# Patient Record
Sex: Male | Born: 2000 | Hispanic: Yes | Marital: Single
Health system: Southern US, Community
[De-identification: ages and names within clinical notes are randomized; demographics above are authoritative.]

---

## 2021-04-06 ENCOUNTER — Inpatient Hospital Stay (HOSPITAL_COMMUNITY): Payer: Self-pay

## 2021-04-06 ENCOUNTER — Inpatient Hospital Stay (HOSPITAL_COMMUNITY)
Admission: EM | Admit: 2021-04-06 | Discharge: 2021-04-09 | DRG: 871 | Disposition: A | Discharge status: Home / Self Care | Payer: Self-pay | Attending: Internal Medicine | Admitting: Internal Medicine

## 2021-04-06 ENCOUNTER — Emergency Department (HOSPITAL_COMMUNITY): Payer: Self-pay

## 2021-04-06 ENCOUNTER — Encounter (HOSPITAL_COMMUNITY): Payer: Self-pay | Admitting: Emergency Medicine

## 2021-04-06 DIAGNOSIS — R6521 Severe sepsis with septic shock: Secondary | ICD-10-CM | POA: Diagnosis present

## 2021-04-06 DIAGNOSIS — E876 Hypokalemia: Secondary | ICD-10-CM | POA: Diagnosis present

## 2021-04-06 DIAGNOSIS — E871 Hypo-osmolality and hyponatremia: Secondary | ICD-10-CM | POA: Diagnosis present

## 2021-04-06 DIAGNOSIS — E878 Other disorders of electrolyte and fluid balance, not elsewhere classified: Secondary | ICD-10-CM | POA: Diagnosis present

## 2021-04-06 DIAGNOSIS — R410 Disorientation, unspecified: Secondary | ICD-10-CM

## 2021-04-06 DIAGNOSIS — R569 Unspecified convulsions: Secondary | ICD-10-CM

## 2021-04-06 DIAGNOSIS — J101 Influenza due to other identified influenza virus with other respiratory manifestations: Secondary | ICD-10-CM | POA: Diagnosis present

## 2021-04-06 DIAGNOSIS — A419 Sepsis, unspecified organism: Principal | ICD-10-CM | POA: Diagnosis present

## 2021-04-06 DIAGNOSIS — Z66 Do not resuscitate: Secondary | ICD-10-CM | POA: Diagnosis present

## 2021-04-06 DIAGNOSIS — R4182 Altered mental status, unspecified: Secondary | ICD-10-CM | POA: Diagnosis present

## 2021-04-06 DIAGNOSIS — G9341 Metabolic encephalopathy: Secondary | ICD-10-CM | POA: Diagnosis present

## 2021-04-06 DIAGNOSIS — Z20822 Contact with and (suspected) exposure to covid-19: Secondary | ICD-10-CM | POA: Diagnosis present

## 2021-04-06 LAB — LIPASE, BLOOD: Lipase: 27 U/L (ref 11–51)

## 2021-04-06 LAB — COMPREHENSIVE METABOLIC PANEL
ALT: 29 U/L (ref 0–44)
AST: 29 U/L (ref 15–41)
Albumin: 4.4 g/dL (ref 3.5–5.0)
Alkaline Phosphatase: 91 U/L (ref 38–126)
Anion gap: 12 (ref 5–15)
BUN: 16 mg/dL (ref 6–20)
CO2: 22 mmol/L (ref 22–32)
Calcium: 9.8 mg/dL (ref 8.9–10.3)
Chloride: 97 mmol/L — ABNORMAL LOW (ref 98–111)
Creatinine, Ser: 1.02 mg/dL (ref 0.61–1.24)
GFR, Estimated: >60 mL/min (ref >60)
Glucose, Bld: 100 mg/dL — ABNORMAL HIGH (ref 70–99)
Potassium: 3.4 mmol/L — ABNORMAL LOW (ref 3.5–5.1)
Sodium: 131 mmol/L — ABNORMAL LOW (ref 135–145)
Total Bilirubin: 0.6 mg/dL (ref 0.3–1.2)
Total Protein: 7.8 g/dL (ref 6.5–8.1)

## 2021-04-06 LAB — HIV ANTIBODY (ROUTINE TESTING W REFLEX): HIV Screen 4th Generation wRfx: NONREACTIVE

## 2021-04-06 LAB — I-STAT VENOUS BLOOD GAS, ED
Acid-Base Excess: 2 mmol/L (ref 0.0–2.0)
Bicarbonate: 24.4 mmol/L (ref 20.0–28.0)
Calcium, Ion: 1.09 mmol/L — ABNORMAL LOW (ref 1.15–1.40)
HCT: 47 % (ref 39.0–52.0)
Hemoglobin: 16 g/dL (ref 13.0–17.0)
O2 Saturation: 74 %
Potassium: 3.3 mmol/L — ABNORMAL LOW (ref 3.5–5.1)
Sodium: 135 mmol/L (ref 135–145)
TCO2: 25 mmol/L (ref 22–32)
pCO2, Ven: 31 mmHg — ABNORMAL LOW (ref 44.0–60.0)
pH, Ven: 7.504 — ABNORMAL HIGH (ref 7.250–7.430)
pO2, Ven: 35 mmHg (ref 32.0–45.0)

## 2021-04-06 LAB — LACTIC ACID, PLASMA: Lactic Acid, Venous: 1.2 mmol/L (ref 0.5–1.9)

## 2021-04-06 LAB — URINALYSIS, ROUTINE W REFLEX MICROSCOPIC
Bilirubin Urine: NEGATIVE
Glucose, UA: NEGATIVE mg/dL
Hgb urine dipstick: NEGATIVE
Ketones, ur: 5 mg/dL — AB
Leukocytes,Ua: NEGATIVE
Nitrite: NEGATIVE
Protein, ur: NEGATIVE mg/dL
Specific Gravity, Urine: 1.026 (ref 1.005–1.030)
pH: 7 (ref 5.0–8.0)

## 2021-04-06 LAB — RAPID URINE DRUG SCREEN, HOSP PERFORMED
Amphetamines: NOT DETECTED
Barbiturates: NOT DETECTED
Benzodiazepines: POSITIVE — AB
Cocaine: NOT DETECTED
Opiates: POSITIVE — AB
Tetrahydrocannabinol: NOT DETECTED

## 2021-04-06 LAB — CBC WITH DIFFERENTIAL/PLATELET
Abs Immature Granulocytes: 0.06 10*3/uL (ref 0.00–0.07)
Basophils Absolute: 0.1 10*3/uL (ref 0.0–0.1)
Basophils Relative: 1 %
Eosinophils Absolute: 0.1 10*3/uL (ref 0.0–0.5)
Eosinophils Relative: 1 %
HCT: 45 % (ref 39.0–52.0)
Hemoglobin: 15.1 g/dL (ref 13.0–17.0)
Immature Granulocytes: 1 %
Lymphocytes Relative: 7 %
Lymphs Abs: 0.8 10*3/uL (ref 0.7–4.0)
MCH: 28.9 pg (ref 26.0–34.0)
MCHC: 33.6 g/dL (ref 30.0–36.0)
MCV: 86.2 fL (ref 80.0–100.0)
Monocytes Absolute: 0.7 10*3/uL (ref 0.1–1.0)
Monocytes Relative: 6 %
Neutro Abs: 9.9 10*3/uL — ABNORMAL HIGH (ref 1.7–7.7)
Neutrophils Relative %: 84 %
Platelets: 376 10*3/uL (ref 150–400)
RBC: 5.22 MIL/uL (ref 4.22–5.81)
RDW: 12.6 % (ref 11.5–15.5)
WBC: 11.6 10*3/uL — ABNORMAL HIGH (ref 4.0–10.5)
nRBC: 0 % (ref 0.0–0.2)

## 2021-04-06 LAB — RESP PANEL BY RT-PCR (FLU A&B, COVID) ARPGX2
Influenza A by PCR: POSITIVE — AB
Influenza B by PCR: NEGATIVE
SARS Coronavirus 2 by RT PCR: NEGATIVE

## 2021-04-06 LAB — ACETAMINOPHEN LEVEL: Acetaminophen (Tylenol), Serum: <10 ug/mL — ABNORMAL LOW (ref 10–30)

## 2021-04-06 LAB — OSMOLALITY: Osmolality: 280 mOsm/kg (ref 275–295)

## 2021-04-06 LAB — CBG MONITORING, ED: Glucose-Capillary: 85 mg/dL (ref 70–99)

## 2021-04-06 LAB — MAGNESIUM: Magnesium: 1.5 mg/dL — ABNORMAL LOW (ref 1.7–2.4)

## 2021-04-06 LAB — CK: Total CK: 306 U/L (ref 49–397)

## 2021-04-06 LAB — SALICYLATE LEVEL: Salicylate Lvl: <7 mg/dL — ABNORMAL LOW (ref 7.0–30.0)

## 2021-04-06 IMAGING — MR MR HEAD WO/W CM
14 of 16 series · 40 of 48 positions shown · IV contrast (gadavist)
Comparison: None.

CLINICAL DATA: Fever and new onset seizure

EXAM:
MRI HEAD WITHOUT AND WITH CONTRAST
TECHNIQUE: Multiplanar, multiecho pulse sequences of the brain and surrounding
structures were obtained without and with intravenous contrast.
CONTRAST:  7mL GADAVIST GADOBUTROL 1 MMOL/ML IV SOLN

[Series 5: DWI · axial · 3.0mm · 0.88mm/px · z∈[-138,+14]mm · 6 of 106 slices shown (1 of 4)]
[im 1/106]
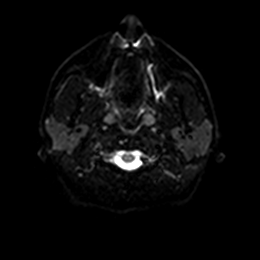
[im 22/106]
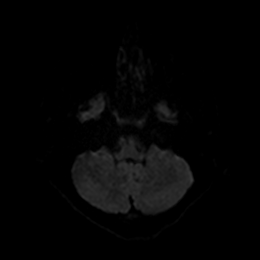
[im 43/106]
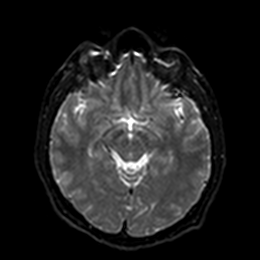
[im 64/106]
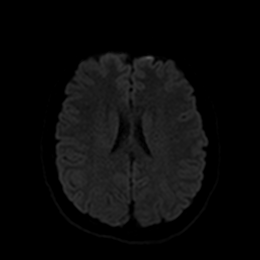
[im 85/106]
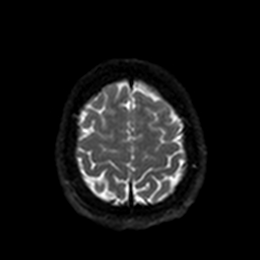
[im 106/106]
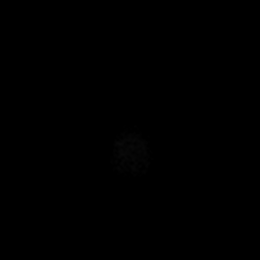

[Series 6: DWI · axial · 3.0mm · 0.88mm/px · z∈[-138,+14]mm · 2 of 52 slices shown (2 of 4)]
[im 1/52]
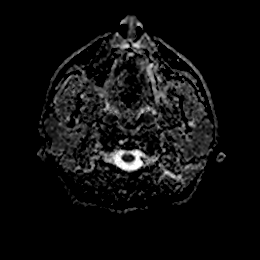
[im 52/52]
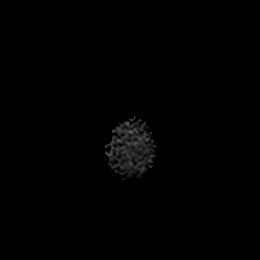

[Series 7: DWI · coronal · 4.0mm · 0.88mm/px · 4 of 74 slices shown (3 of 4)]
[im 1/74]
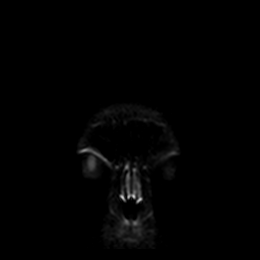
[im 25/74]
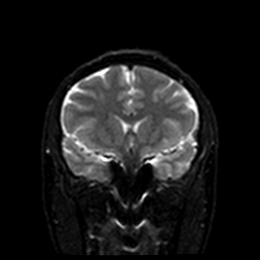
[im 49/74]
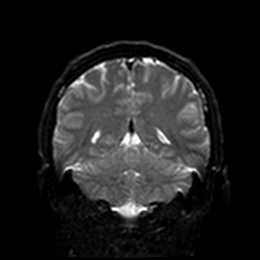
[im 74/74]
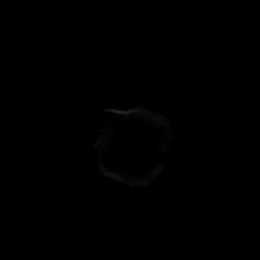

[Series 8: DWI · coronal · 4.0mm · 0.88mm/px · 2 of 37 slices shown (4 of 4)]
[im 1/37]
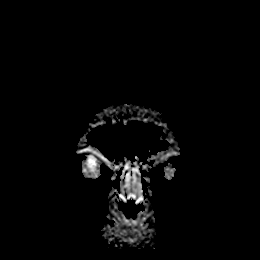
[im 37/37]
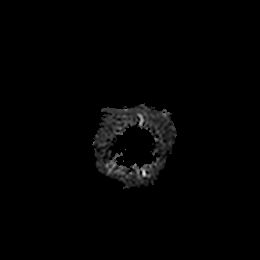

[Series 9: T1 · sagittal · 5.0mm · 0.75mm/px · 2 of 26 slices shown]
[im 1/26]
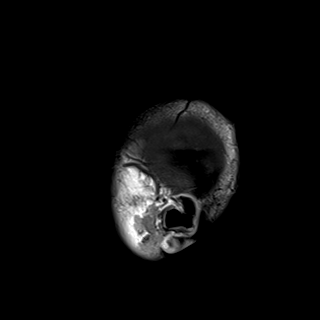
[im 26/26]
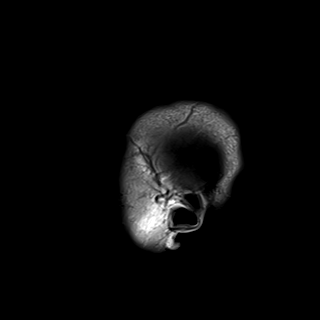

[Series 10: T2 · axial · 5.0mm · 0.72mm/px · z∈[-138,+14]mm · 2 of 27 slices shown]
[im 1/27]
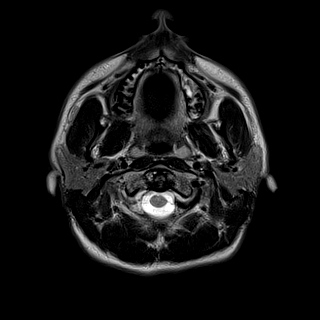
[im 27/27]
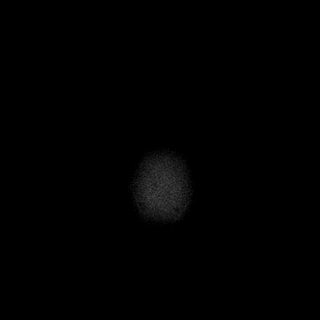

[Series 11: FLAIR · axial · 5.0mm · 0.45mm/px · z∈[-138,+15]mm · 2 of 27 slices shown]
[im 1/27]
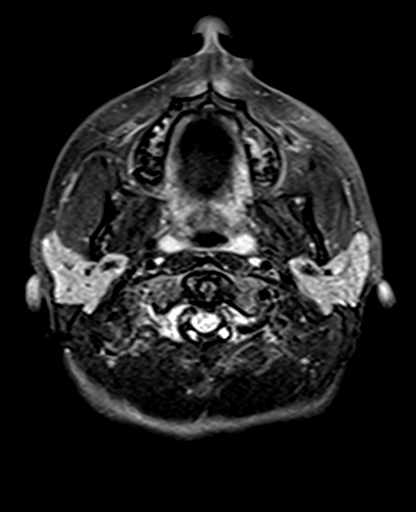
[im 27/27]
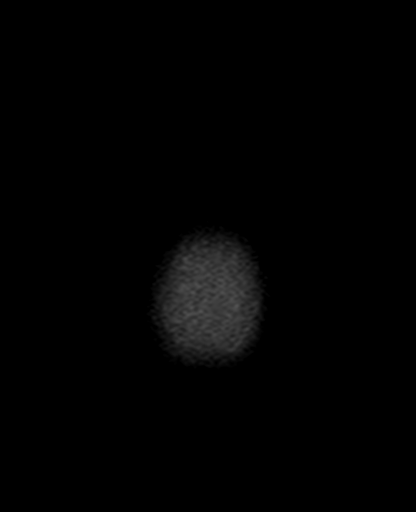

[Series 12: mag_images · axial · 3.0mm · 0.90mm/px · z∈[-142,+19]mm · 4 of 56 slices shown]
[im 1/56]
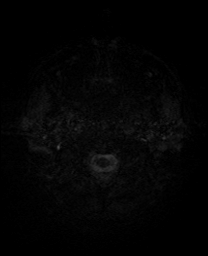
[im 19/56]
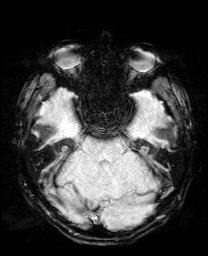
[im 37/56]
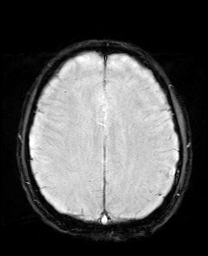
[im 56/56]
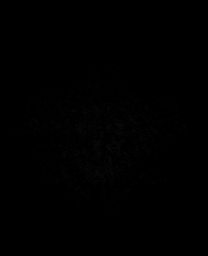

[Series 13: pha_images · axial · 3.0mm · 0.90mm/px · z∈[-142,+14]mm · 3 of 54 slices shown]
[im 1/54]
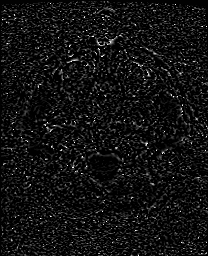
[im 27/54]
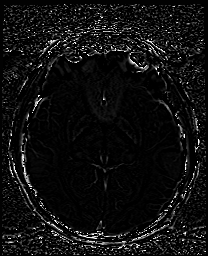
[im 54/54]
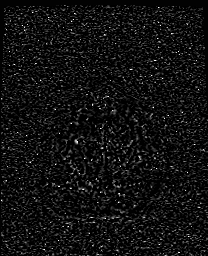

[Series 14: swi_images · axial · 3.0mm · 0.90mm/px · z∈[-142,+19]mm · 4 of 56 slices shown]
[im 1/56]
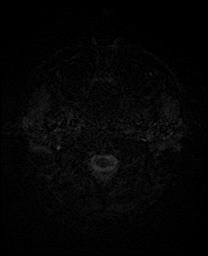
[im 19/56]
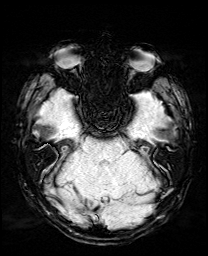
[im 37/56]
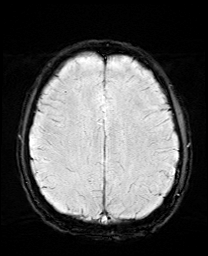
[im 56/56]
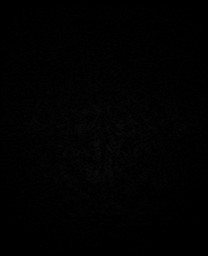

[Series 15: mip_images(sw) · axial · 24.0mm · 0.90mm/px · z∈[-132,+9]mm · 3 of 49 slices shown]
[im 1/49]
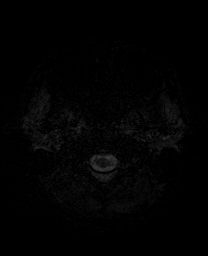
[im 25/49]
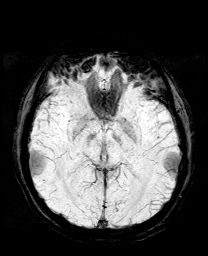
[im 49/49]
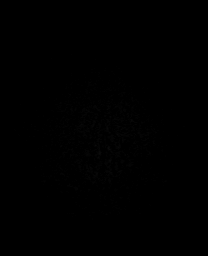

[Series 17: T2 post-contrast · coronal · 5.0mm · 0.72mm/px · 2 of 30 slices shown]
[im 1/30]
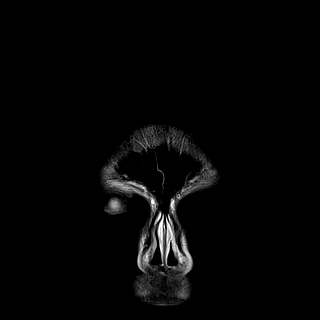
[im 30/30]
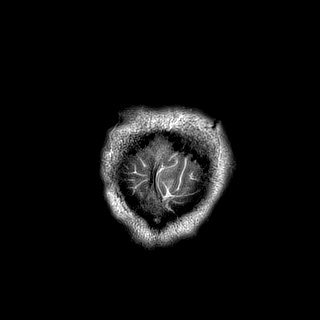

[Series 19: T1 post-contrast · coronal · 5.0mm · 0.34mm/px · 2 of 30 slices shown (1 of 2)]
[im 1/30]
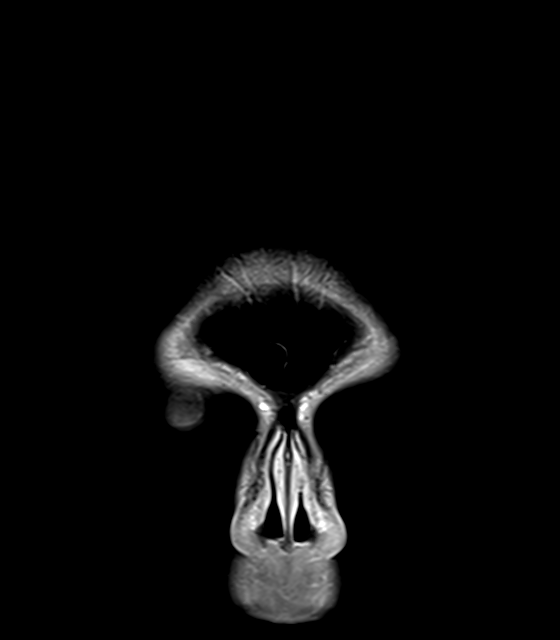
[im 30/30]
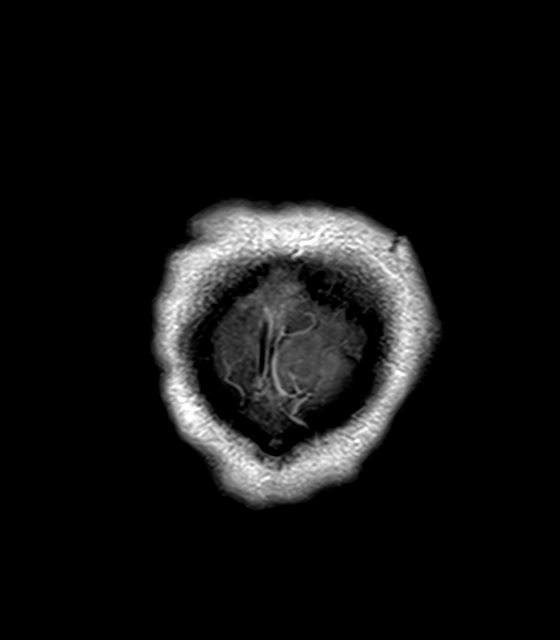

[Series 20: T1 post-contrast · sagittal · 5.0mm · 0.72mm/px · 2 of 26 slices shown (2 of 2)]
[im 1/26]
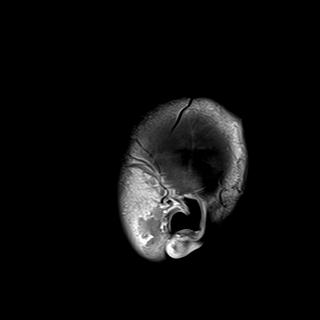
[im 26/26]
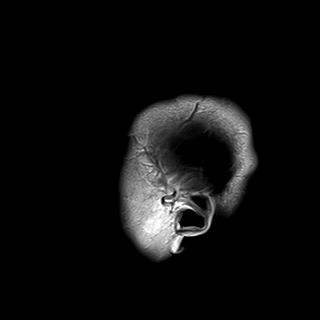

[40 of 48 positions shown; findings below may reference images not displayed]

FINDINGS: Brain: No acute infarct, mass effect or extra-axial collection. No
acute or chronic hemorrhage. Normal white matter signal, parenchymal
volume and CSF spaces. The midline structures are normal. There is
no abnormal contrast enhancement.

Vascular: Major flow voids are preserved.

Skull and upper cervical spine: Normal calvarium and skull base.
Visualized upper cervical spine and soft tissues are normal.

Sinuses/Orbits:No paranasal sinus fluid levels or advanced mucosal
thickening. No mastoid or middle ear effusion. Normal orbits.
IMPRESSION: Normal brain MRI.

## 2021-04-06 IMAGING — MR MR THORACIC SPINE WO/W CM
7 of 11 series · 21 of 48 positions shown · IV contrast (gadavist)
Comparison: None.

CLINICAL DATA: Fever and new onset seizure

EXAM:
MRI THORACIC AND LUMBAR SPINE WITHOUT AND WITH CONTRAST
TECHNIQUE: Multiplanar and multiecho pulse sequences of the thoracic and lumbar
spine were obtained without and with intravenous contrast.
CONTRAST:  7mL GADAVIST GADOBUTROL 1 MMOL/ML IV SOLN

[Series 20: T1 · sagittal · 3.0mm · 0.62mm/px · 1 of 9 slices shown (1 of 5)]
[im 1/9]
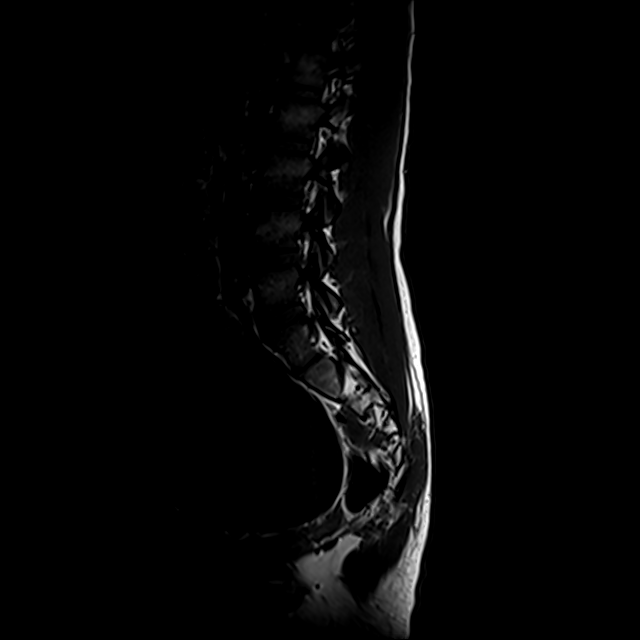

[Series 21: T1 · sagittal · 3.0mm · 0.62mm/px · 2 of 9 slices shown (2 of 5)]
[im 1/9]
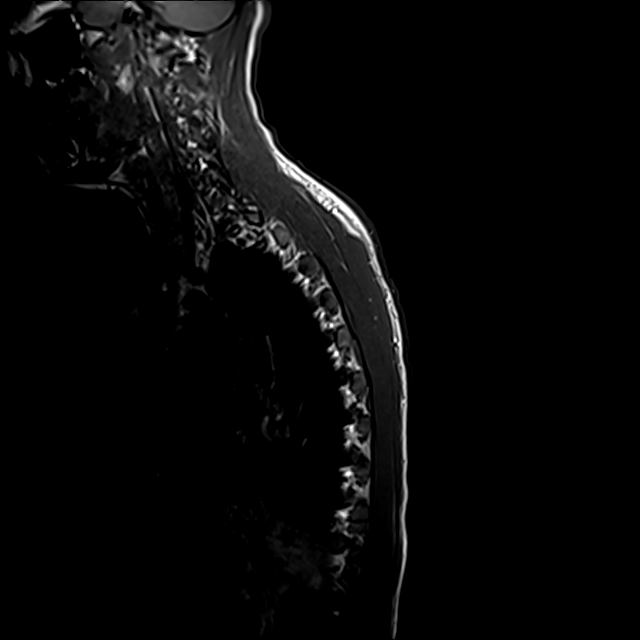
[im 9/9]
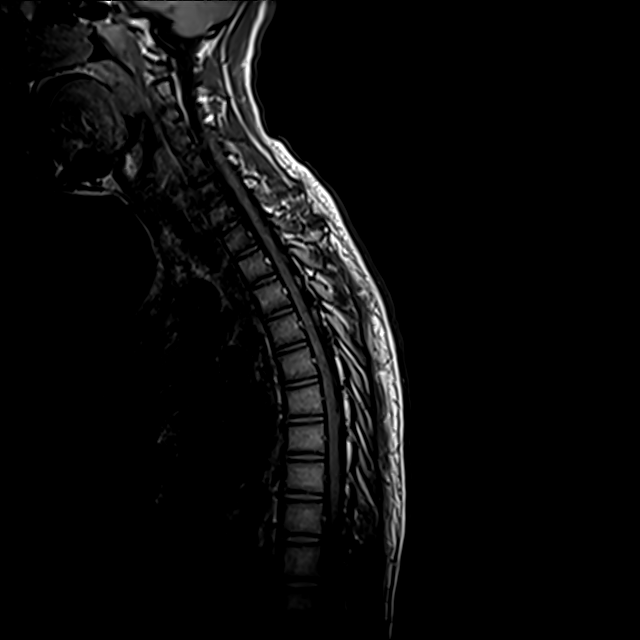

[Series 22: T1 · sagittal · 3.3mm · 0.62mm/px · 1 of 7 slices shown (3 of 5)]
[im 1/7]
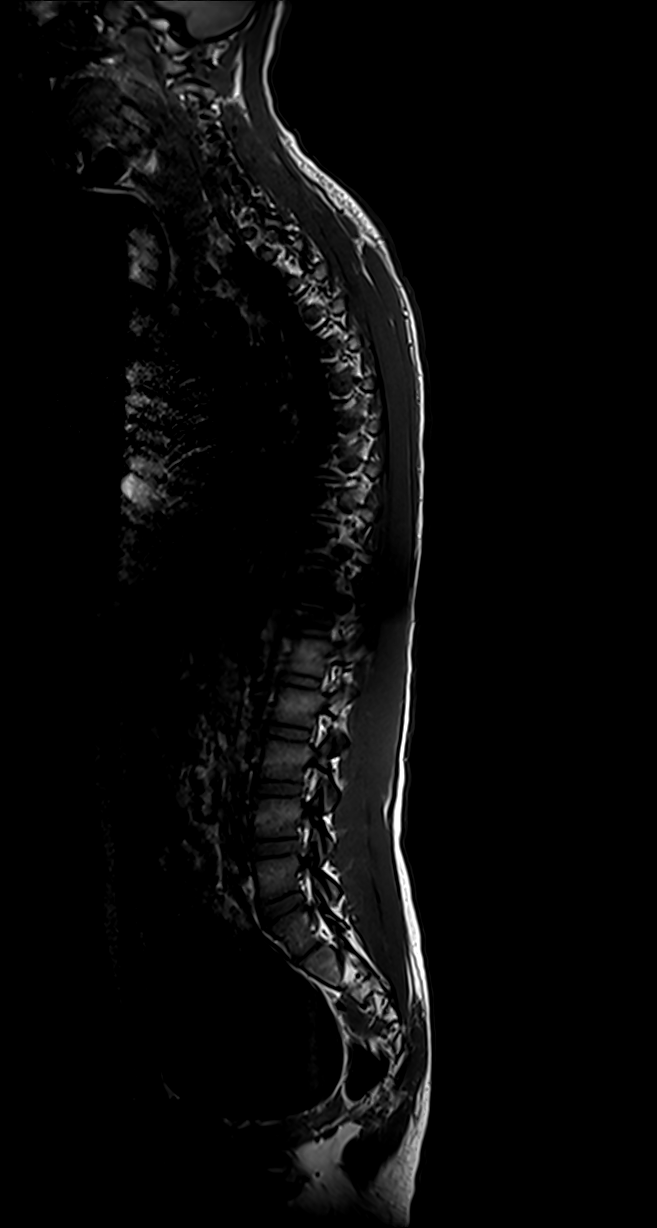

[Series 23: T2 · sagittal · 3.0mm · 0.76mm/px · 3 of 17 slices shown (1 of 2)]
[im 1/17]
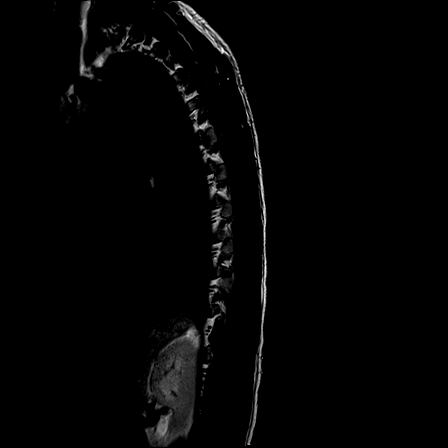
[im 9/17]
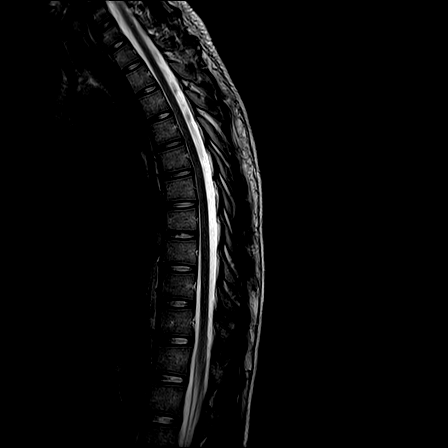
[im 17/17]
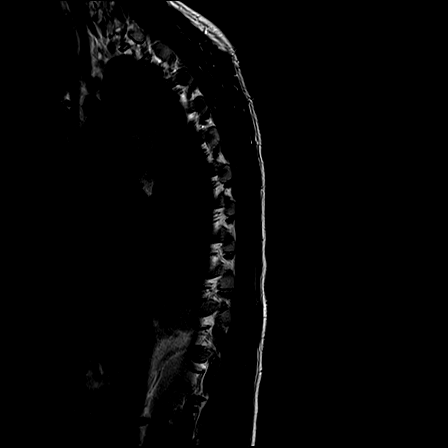

[Series 24: T1 · sagittal · 3.0mm · 0.76mm/px · 3 of 17 slices shown (4 of 5)]
[im 1/17]
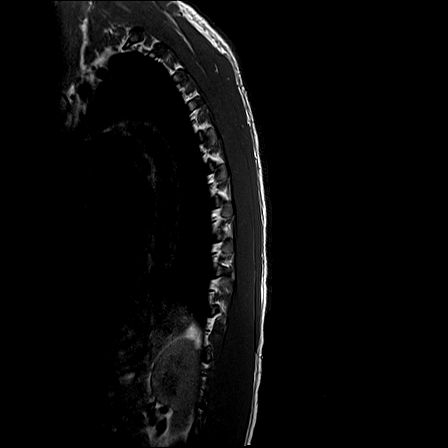
[im 9/17]
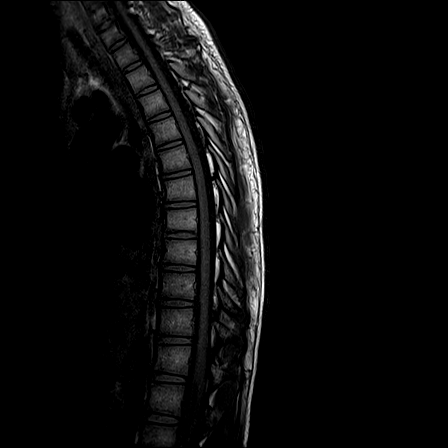
[im 17/17]
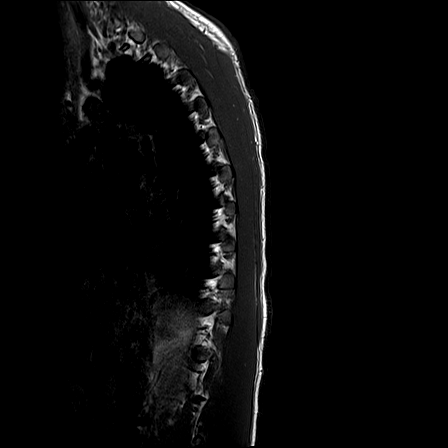

[Series 26: T2 · axial · 5.0mm · 0.59mm/px · z∈[-494,-248]mm · 8 of 39 slices shown (2 of 2)]
[im 1/39]
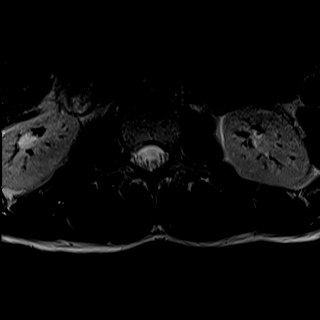
[im 6/39]
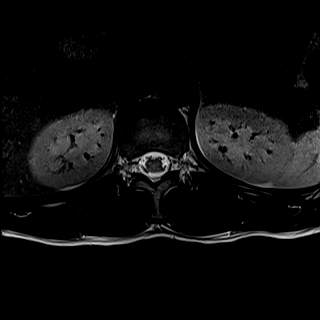
[im 11/39]
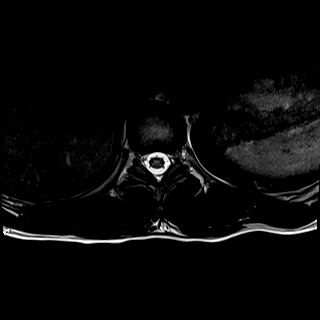
[im 17/39]
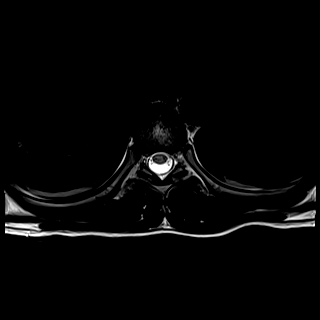
[im 22/39]
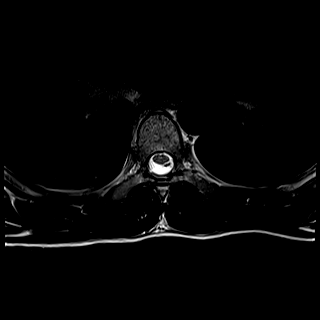
[im 28/39]
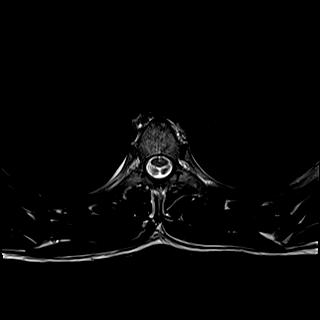
[im 33/39]
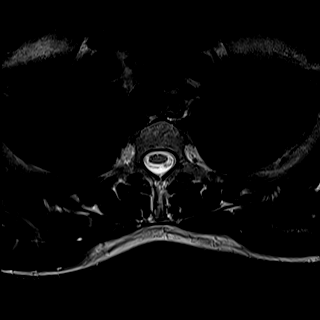
[im 39/39]
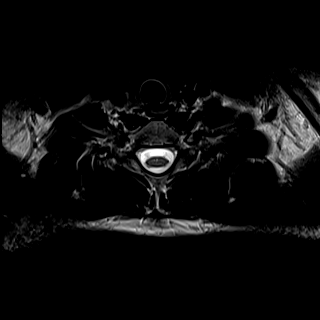

[Series 28: T1 · axial · non-contrast · 5.0mm · 0.31mm/px · z∈[-494,-403]mm · 3 of 39 slices shown (5 of 5)]
[im 1/39]
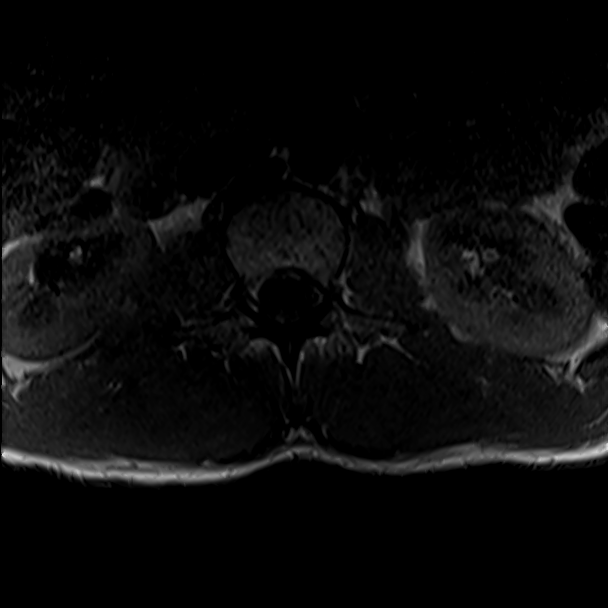
[im 6/39]
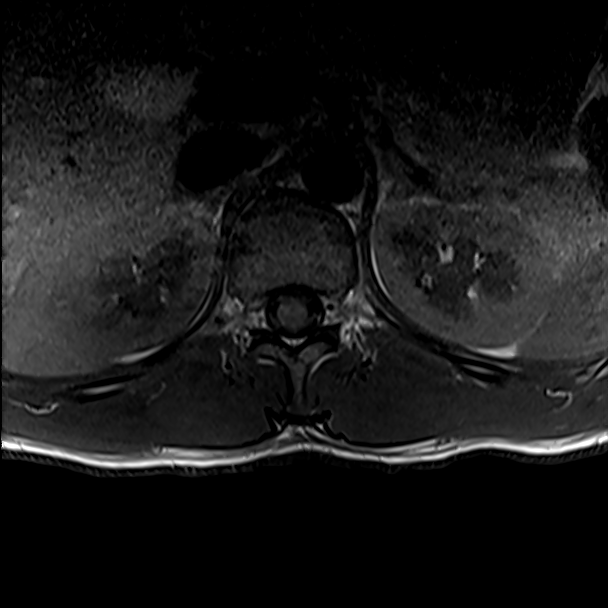
[im 11/39]
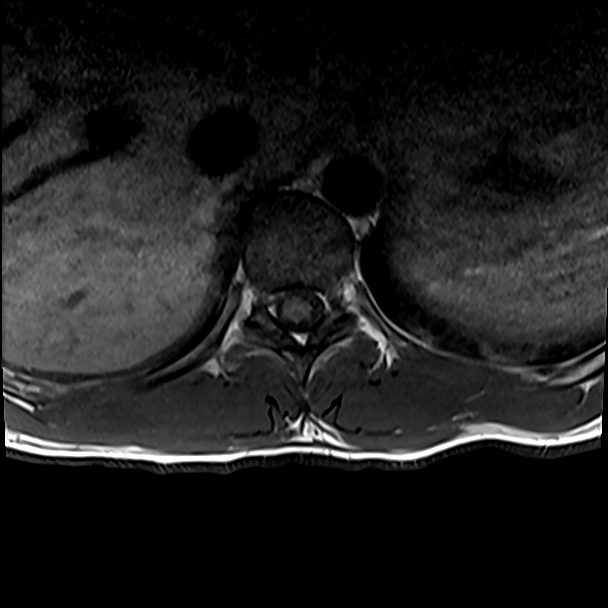

[21 of 48 positions shown; findings below may reference images not displayed]

FINDINGS: MRI THORACIC SPINE FINDINGS

Alignment:  Physiologic.

Vertebrae: No fracture, evidence of discitis, or bone lesion.

Cord:  Normal signal and morphology.

Paraspinal and other soft tissues: Negative.

Disc levels:

No disc herniation, spinal canal stenosis or neural foraminal
stenosis.

MRI LUMBAR SPINE FINDINGS

Segmentation: Transitional lumbosacral anatomy with sacralization of
L5

Alignment:  Physiologic.

Vertebrae:  No fracture, evidence of discitis, or bone lesion.

Conus medullaris: Extends to the L1 level and appears normal.

Paraspinal and other soft tissues: Distended urinary bladder

Disc levels:

No disc herniation, spinal canal stenosis or neural foraminal
stenosis.

No abnormal contrast enhancement.
IMPRESSION: Normal MRI of the thoracic and lumbar spine.

## 2021-04-06 IMAGING — MR MR LUMBAR SPINE WO/W CM
4 of 7 series · 24 of 48 positions shown · IV contrast (Contrast agent)
Comparison: None.

CLINICAL DATA: Fever and new onset seizure

EXAM:
MRI THORACIC AND LUMBAR SPINE WITHOUT AND WITH CONTRAST
TECHNIQUE: Multiplanar and multiecho pulse sequences of the thoracic and lumbar
spine were obtained without and with intravenous contrast.
CONTRAST:  7mL GADAVIST GADOBUTROL 1 MMOL/ML IV SOLN

[Series 5: T2 · sagittal · 4.0mm · 0.73mm/px · 4 of 17 slices shown (1 of 2)]
[im 1/17]
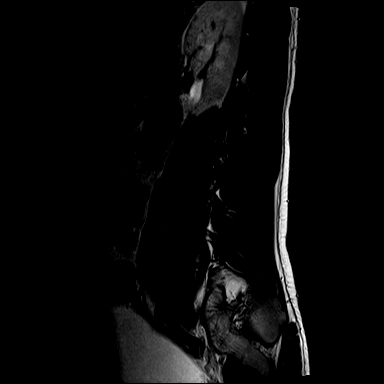
[im 6/17]
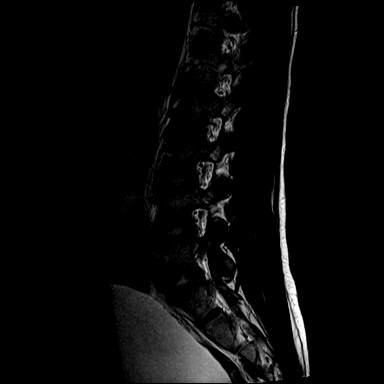
[im 11/17]
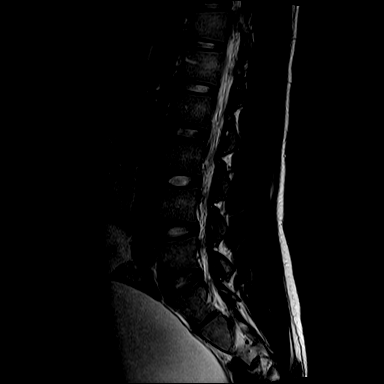
[im 17/17]
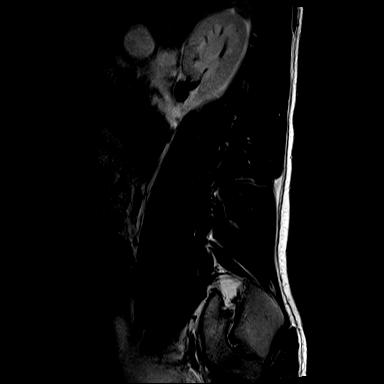

[Series 7: T1 · sagittal · 4.0mm · 0.88mm/px · 5 of 17 slices shown (1 of 2)]
[im 1/17]
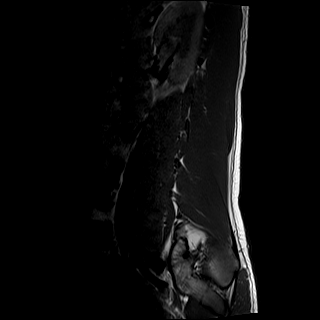
[im 5/17]
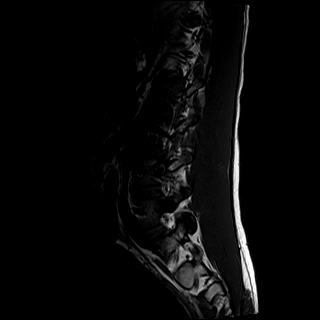
[im 9/17]
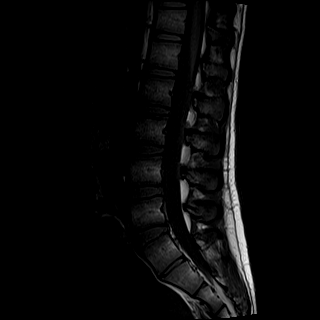
[im 13/17]
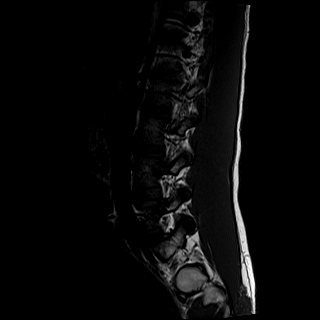
[im 17/17]
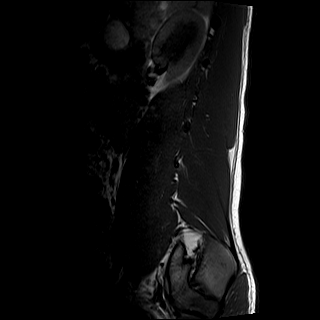

[Series 8: T2 · axial · 5.0mm · 0.57mm/px · z∈[-694,-427]mm · 8 of 34 slices shown (2 of 2)]
[im 1/34]
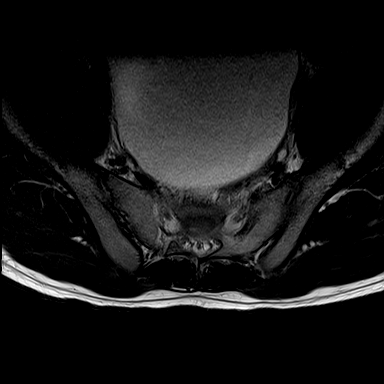
[im 4/34]
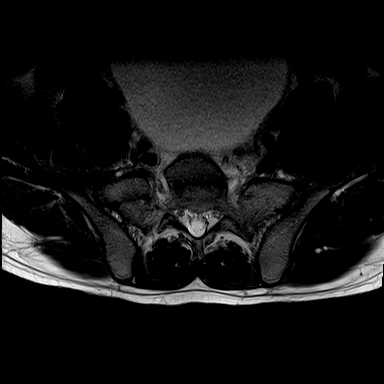
[im 12/34]
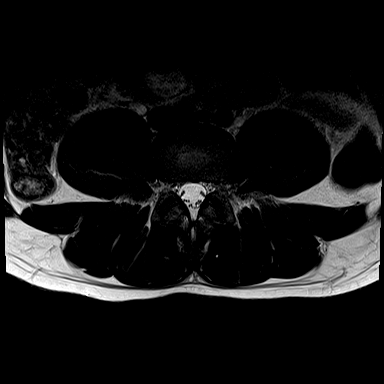
[im 15/34]
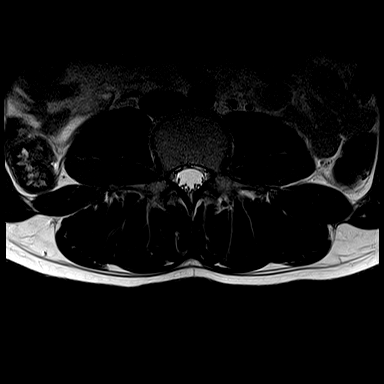
[im 19/34]
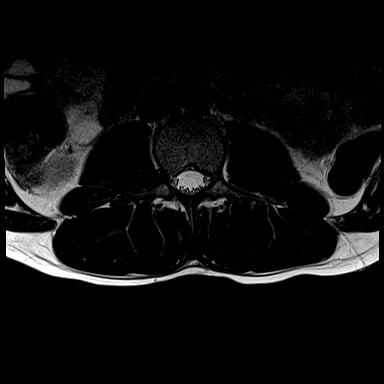
[im 23/34]
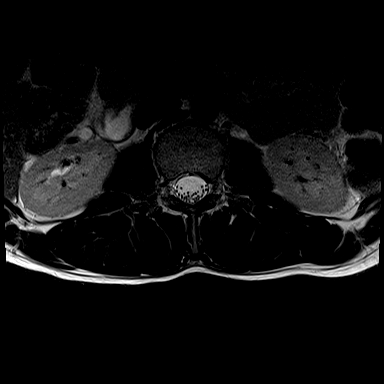
[im 30/34]
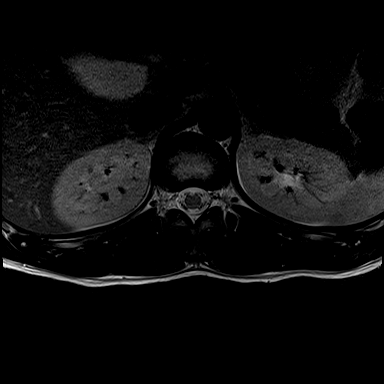
[im 34/34]
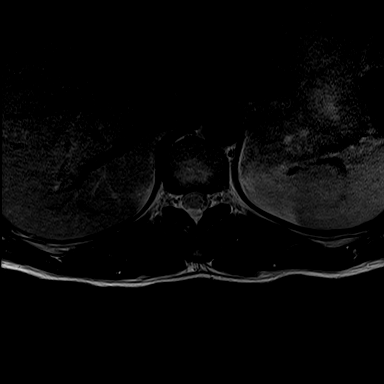

[Series 9: T1 · axial · 5.0mm · 0.34mm/px · z∈[-694,-457]mm · 7 of 34 slices shown (2 of 2)]
[im 1/34]
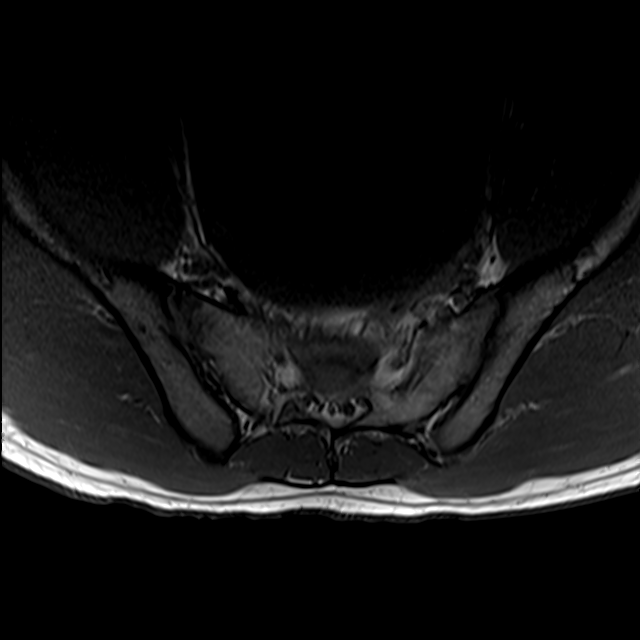
[im 4/34]
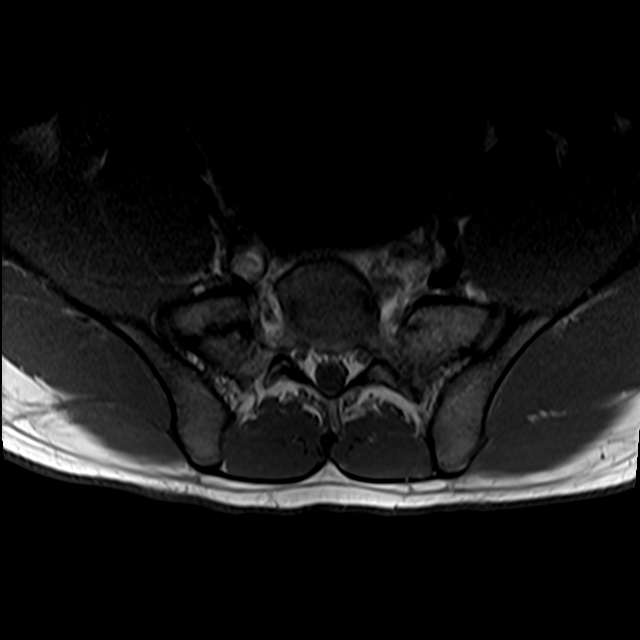
[im 12/34]
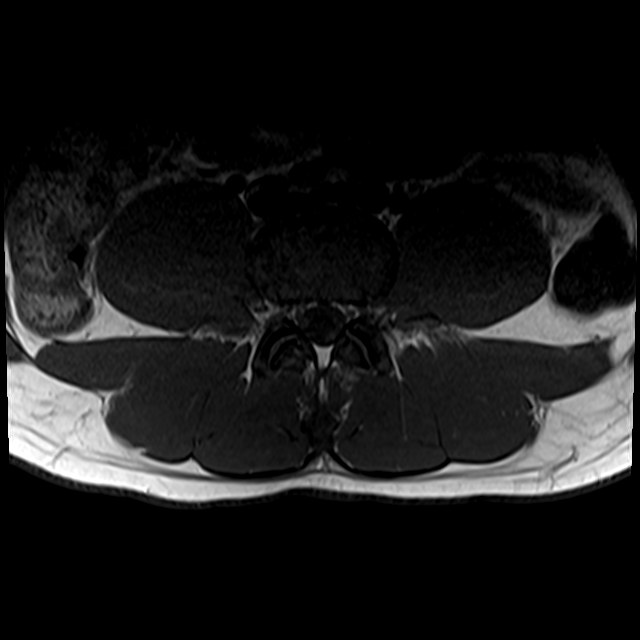
[im 15/34]
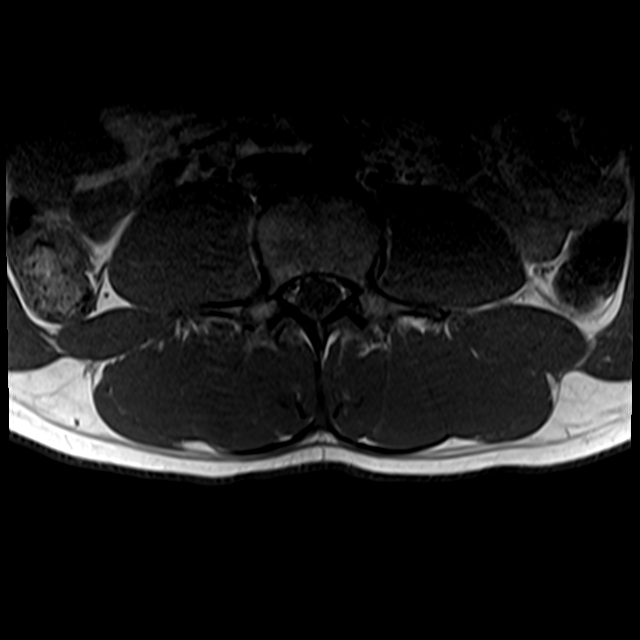
[im 19/34]
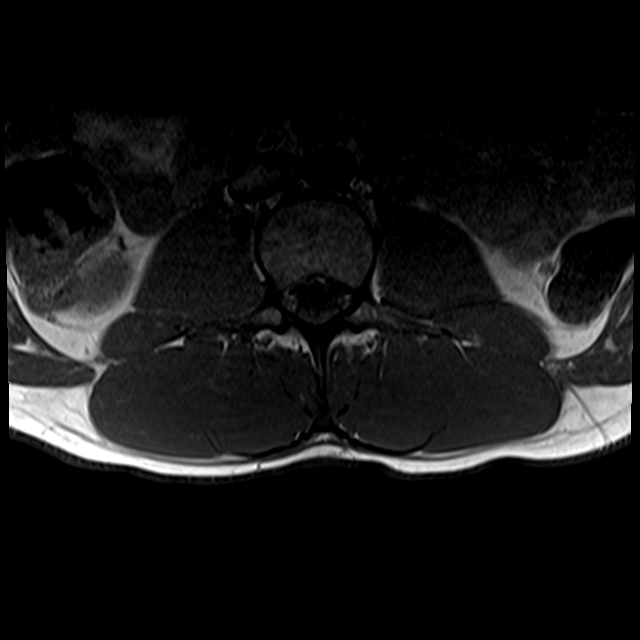
[im 23/34]
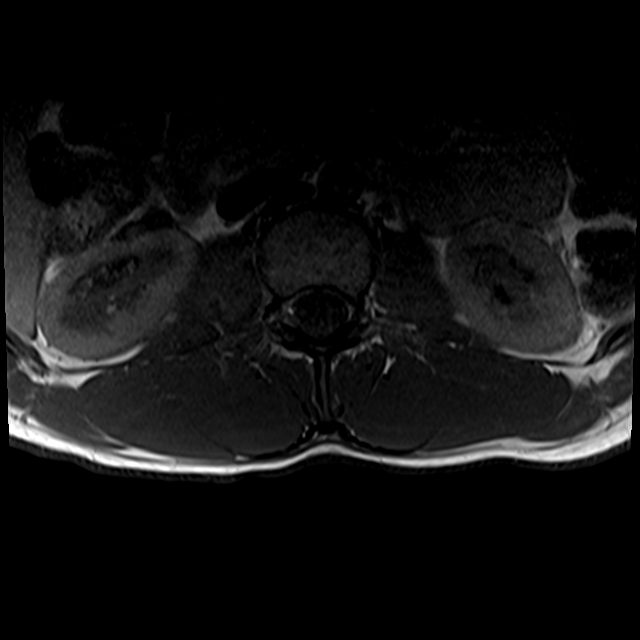
[im 30/34]
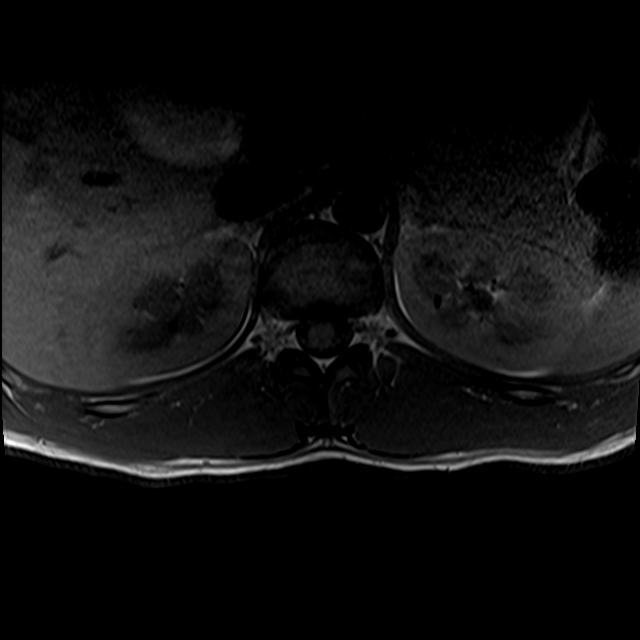

[24 of 48 positions shown; findings below may reference images not displayed]

FINDINGS: MRI THORACIC SPINE FINDINGS

Alignment:  Physiologic.

Vertebrae: No fracture, evidence of discitis, or bone lesion.

Cord:  Normal signal and morphology.

Paraspinal and other soft tissues: Negative.

Disc levels:

No disc herniation, spinal canal stenosis or neural foraminal
stenosis.

MRI LUMBAR SPINE FINDINGS

Segmentation: Transitional lumbosacral anatomy with sacralization of
L5

Alignment:  Physiologic.

Vertebrae:  No fracture, evidence of discitis, or bone lesion.

Conus medullaris: Extends to the L1 level and appears normal.

Paraspinal and other soft tissues: Distended urinary bladder

Disc levels:

No disc herniation, spinal canal stenosis or neural foraminal
stenosis.

No abnormal contrast enhancement.
IMPRESSION: Normal MRI of the thoracic and lumbar spine.

## 2021-04-06 IMAGING — CT CT HEAD W/O CM
4 series · 17 of 47 positions shown, 19 images · non-contrast
Comparison: None

CLINICAL DATA: First-time seizure

EXAM:
CT HEAD WITHOUT CONTRAST
TECHNIQUE: Contiguous axial images were obtained from the base of the skull
through the vertex without intravenous contrast.

[Series 3: head without · axial · non-contrast · 0.41mm/px · z∈[-141,-21]mm · 6 of 34 slices shown, 8 images]
[im 5/34  brain]
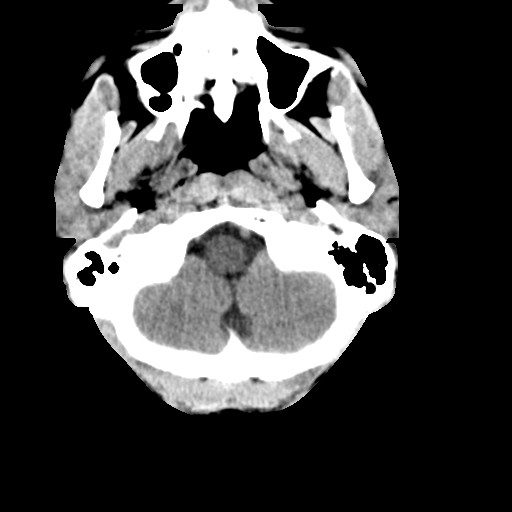
[im 5/34  bone]
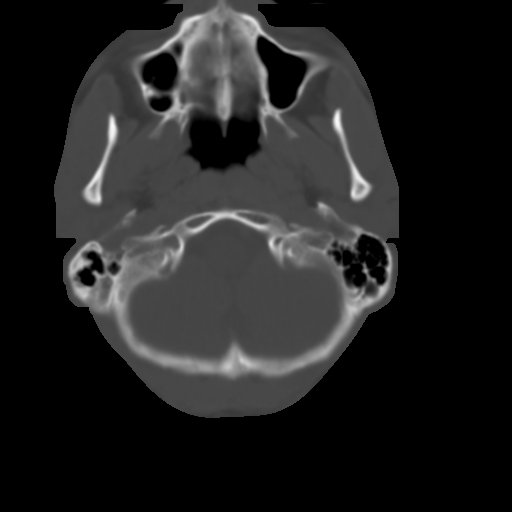
[im 10/34  brain]
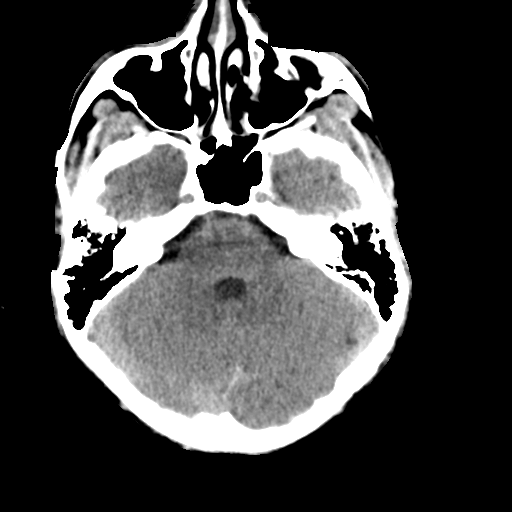
[im 15/34  brain]
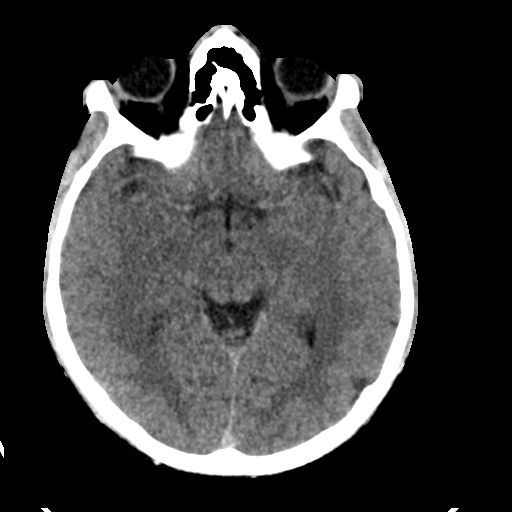
[im 19/34  brain]
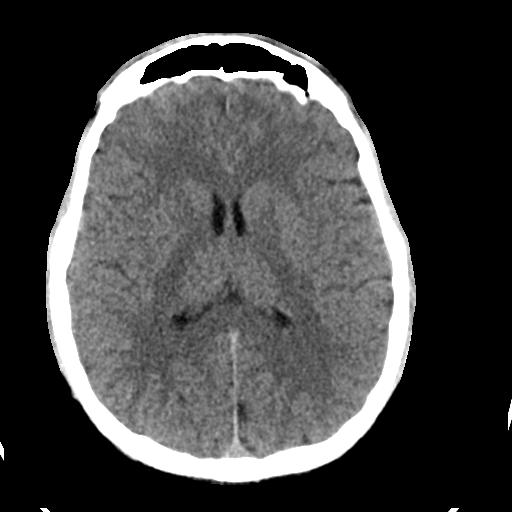
[im 24/34  brain]
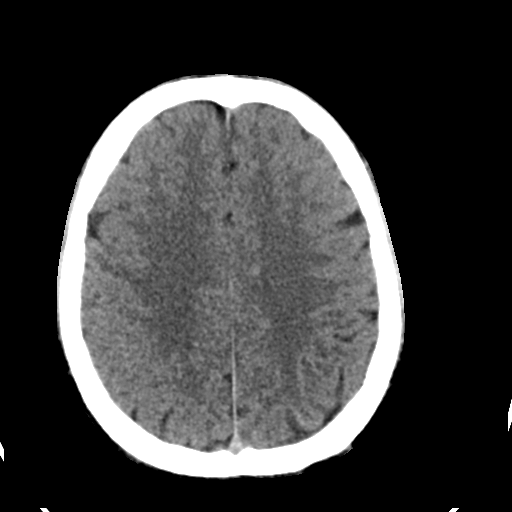
[im 24/34  bone]
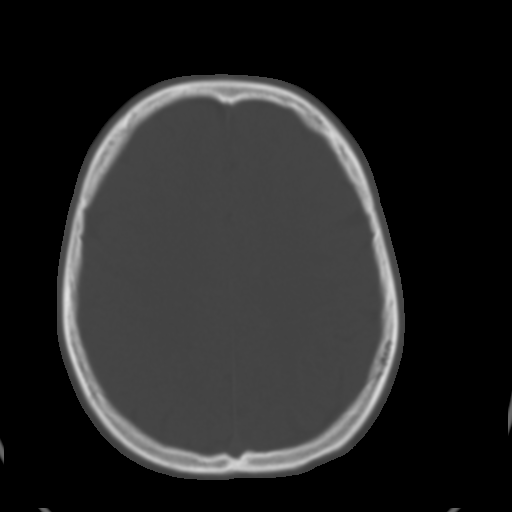
[im 29/34  brain]
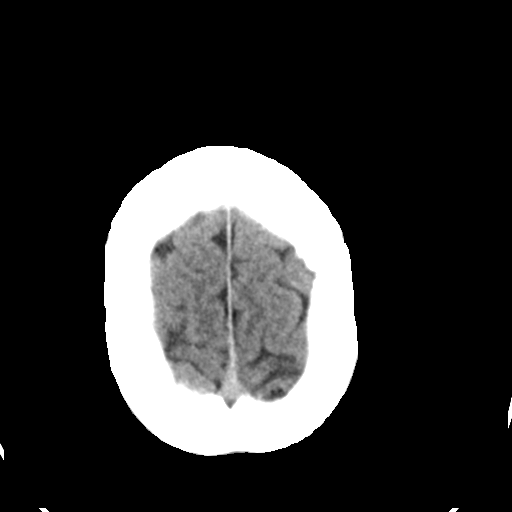

[Series 4: head bone · axial · 0.41mm/px · z∈[-145,-61]mm · 5 of 89 slices shown]
[im 9/89  bone]
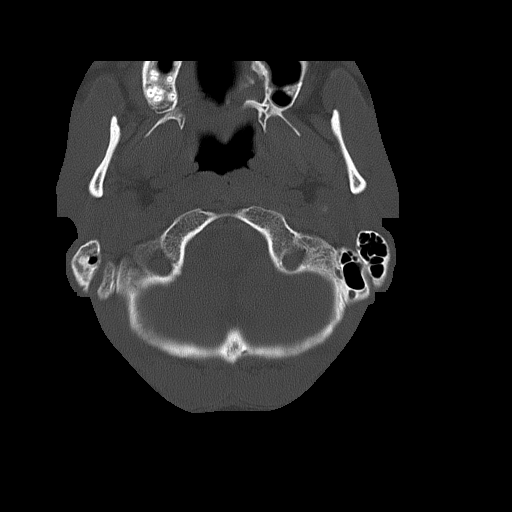
[im 17/89  bone]
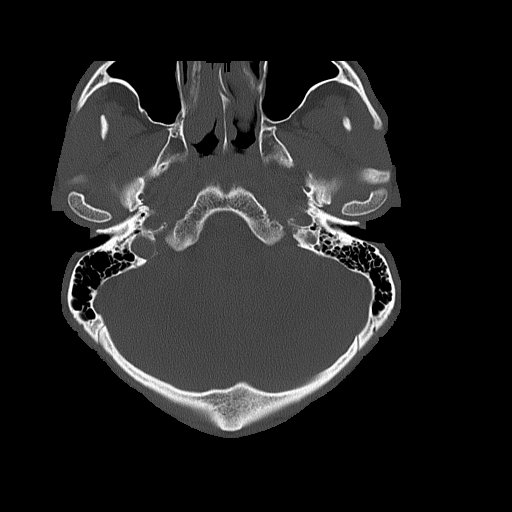
[im 30/89  bone]
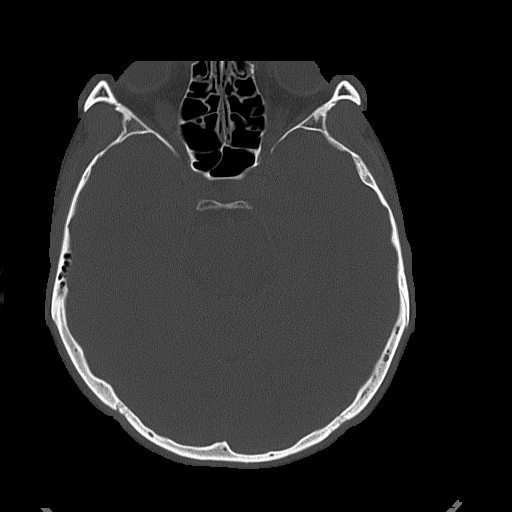
[im 38/89  bone]
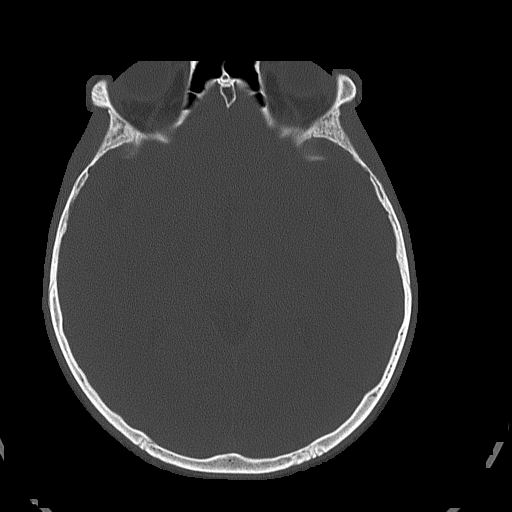
[im 51/89  bone]
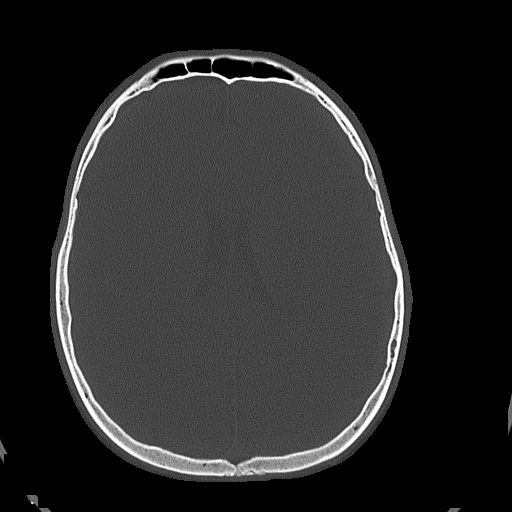

[Series 5: head without cor · coronal · non-contrast · 0.35mm/px · 3 of 71 slices shown]
[im 24/71  brain]
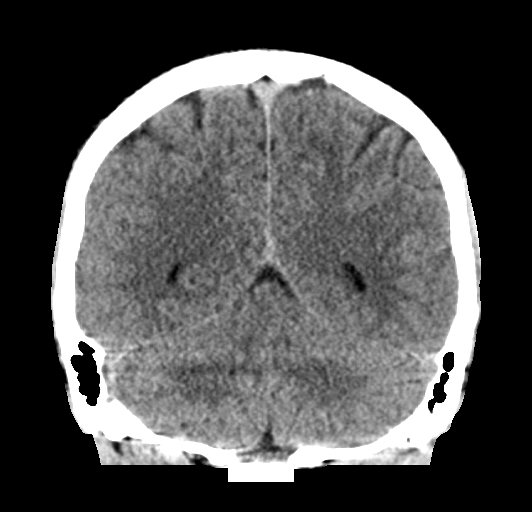
[im 32/71  brain]
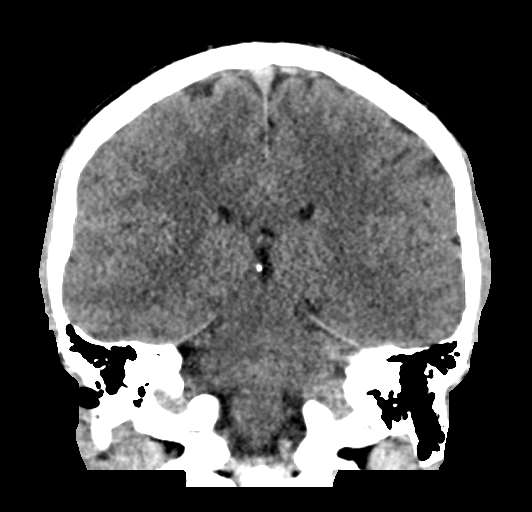
[im 39/71  brain]
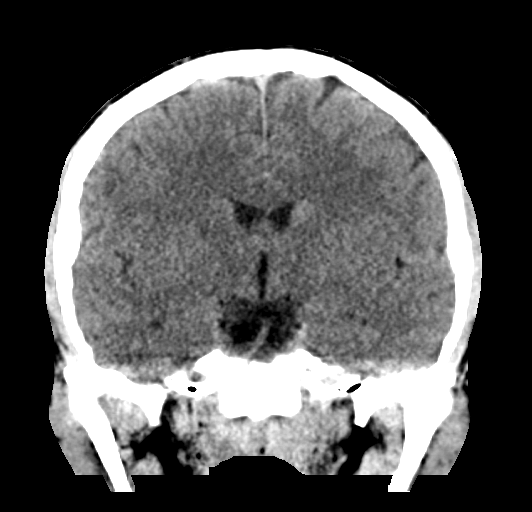

[Series 6: head without sag · sagittal · non-contrast · 0.35mm/px · 3 of 59 slices shown]
[im 20/59  brain]
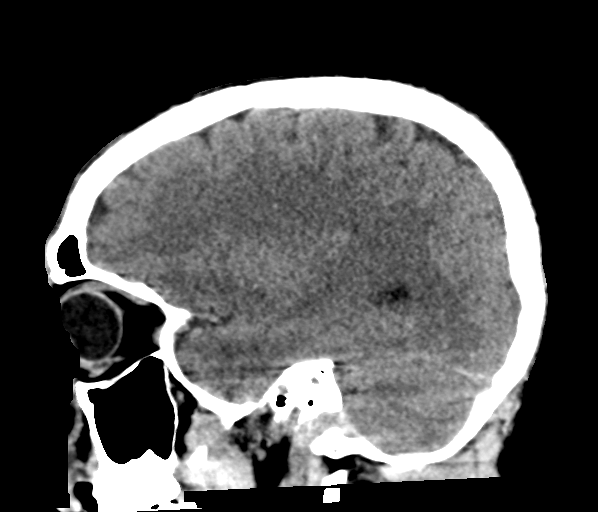
[im 30/59  brain]
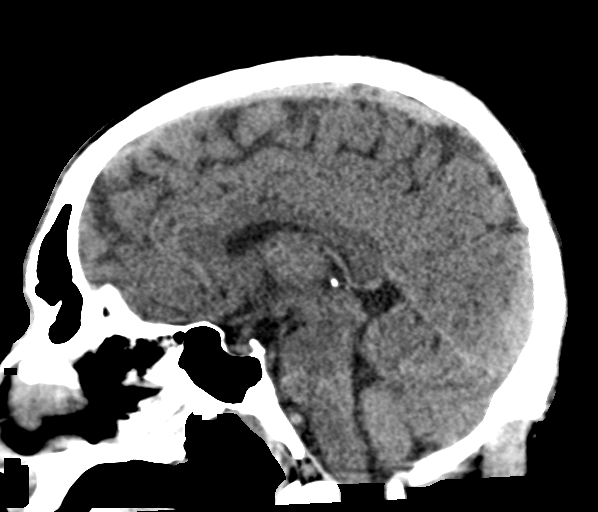
[im 39/59  brain]
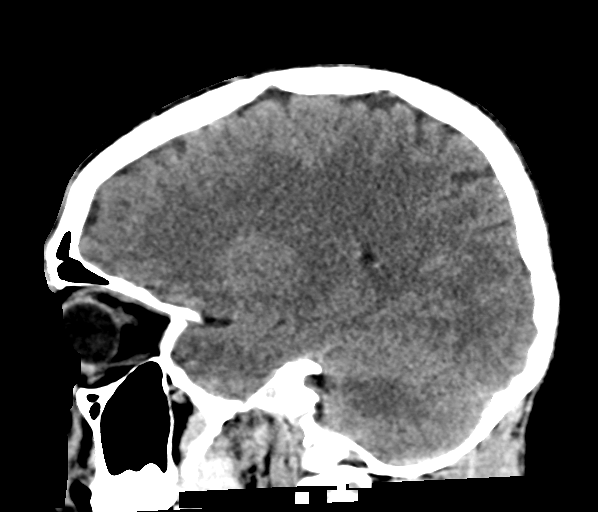

[17 of 47 positions shown; findings below may reference images not displayed]

FINDINGS: Brain:

There is no evidence of acute intracranial hemorrhage, extra-axial
fluid collection, or acute infarct.

Parenchymal volume is normal. The ventricles are normal in size.
There is no mass lesion. There is no midline shift.

Vascular: No hyperdense vessel or unexpected calcification.

Skull: Normal. Negative for fracture or focal lesion.

Sinuses/Orbits: The paranasal sinuses are clear. The globes and
orbits are unremarkable.

Other: None.
IMPRESSION: Normal head CT.

## 2021-04-06 IMAGING — CT CT ANGIO CHEST-ABD-PELV FOR DISSECTION W/ AND WO/W CM
2 of 7 series · 13 of 46 positions shown, 15 images · IV contrast (OMNI 350)
Comparison: None.

CLINICAL DATA: Chest pain, back pain

EXAM:
CT ANGIOGRAPHY CHEST, ABDOMEN AND PELVIS
TECHNIQUE: Non-contrast CT of the chest was initially obtained.

[Series 7: dissection 2mm · axial · 0.75mm/px · z∈[-864,-286]mm · 10 of 325 slices shown, 12 images]
[im 18/325  soft-tissue]
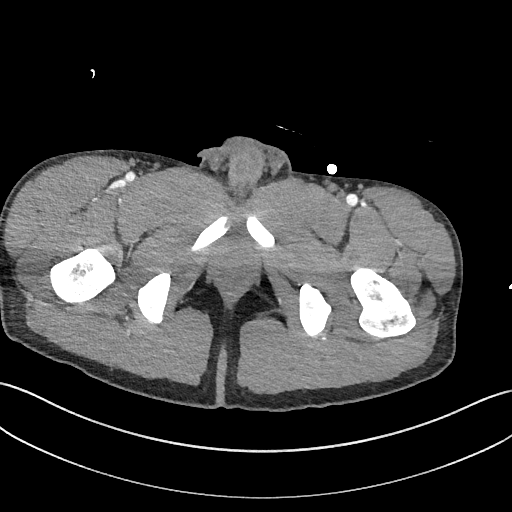
[im 18/325  bone]
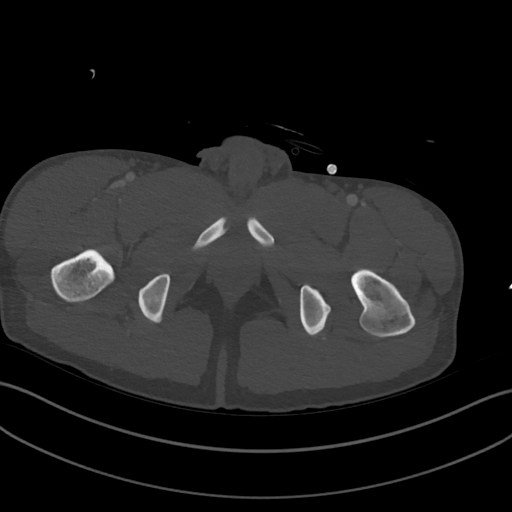
[im 52/325  soft-tissue]
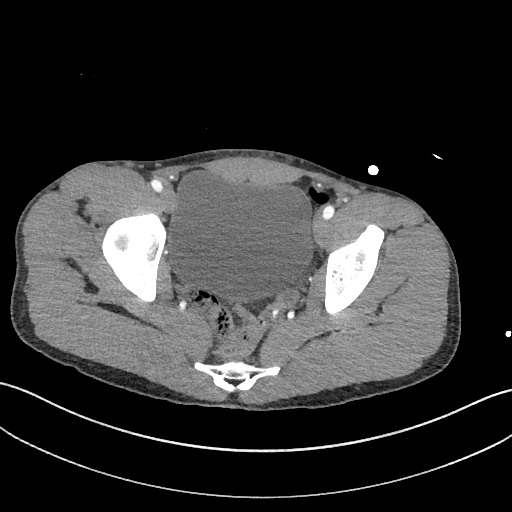
[im 86/325  soft-tissue]
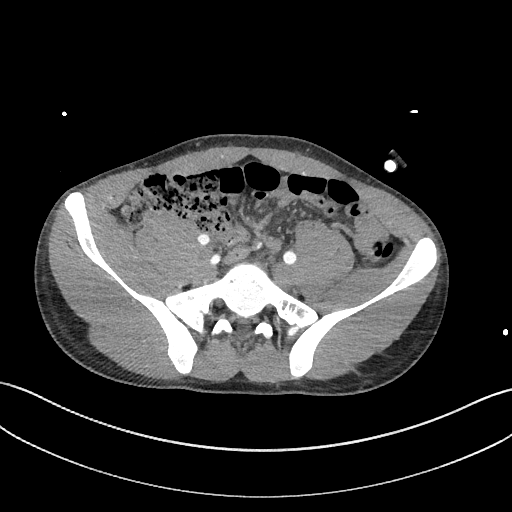
[im 120/325  soft-tissue]
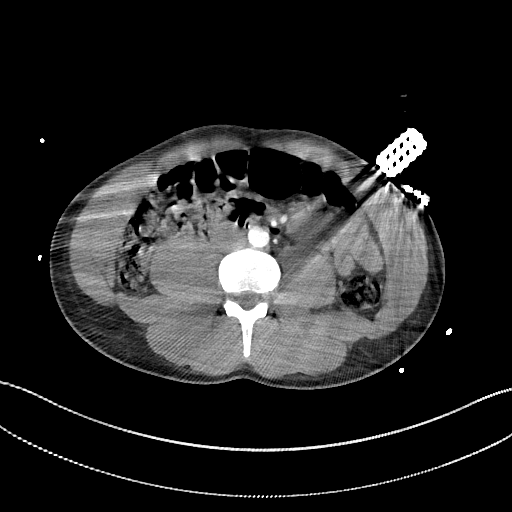
[im 154/325  soft-tissue]
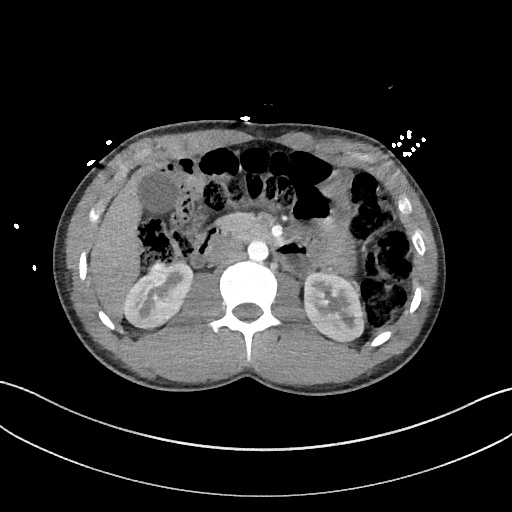
[im 171/325  soft-tissue]
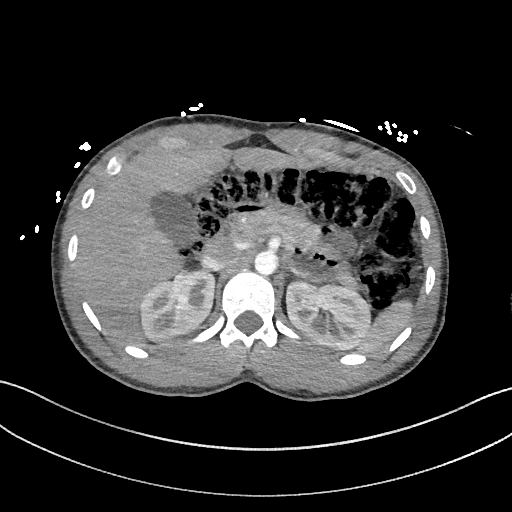
[im 205/325  soft-tissue]
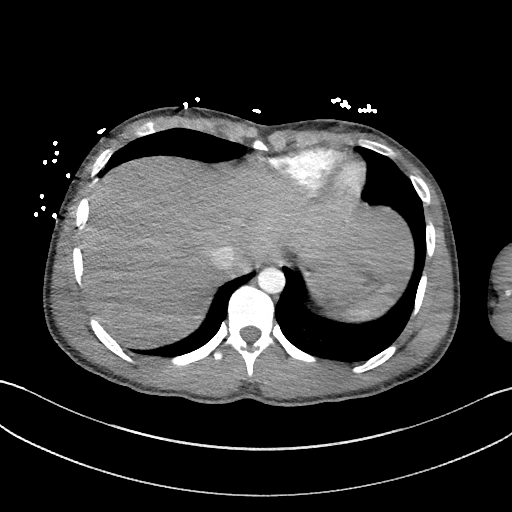
[im 239/325  soft-tissue]
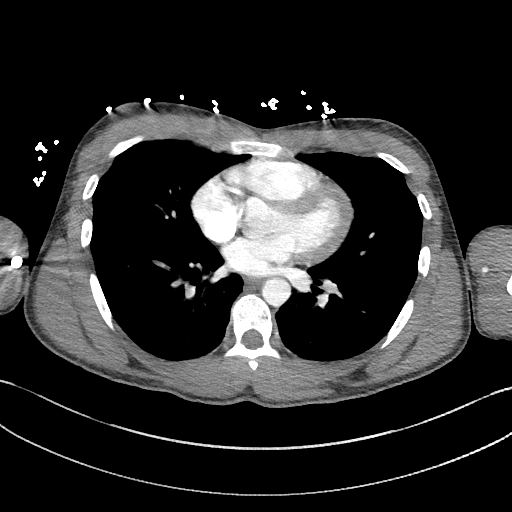
[im 273/325  soft-tissue]
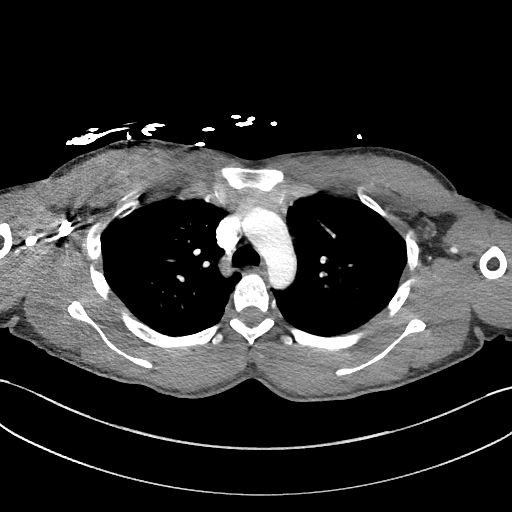
[im 273/325  bone]
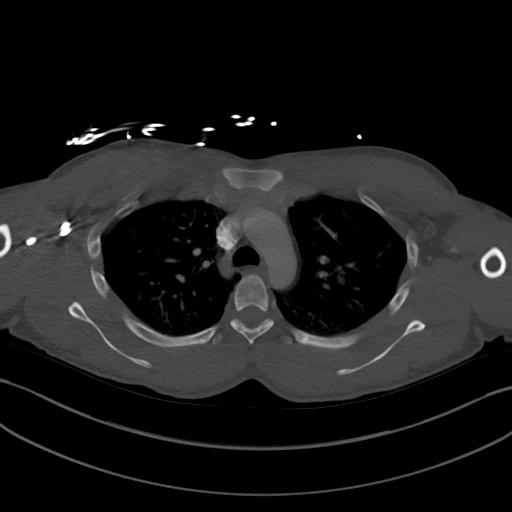
[im 307/325  soft-tissue]
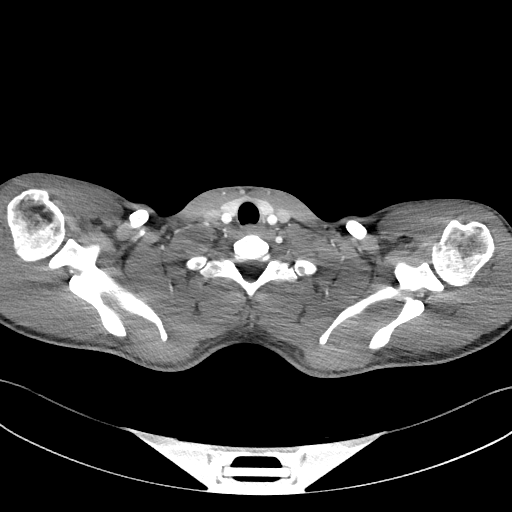

[Series 10: dissection 2mm cor · coronal · 0.69mm/px · 3 of 138 slices shown]
[im 35/138  soft-tissue]
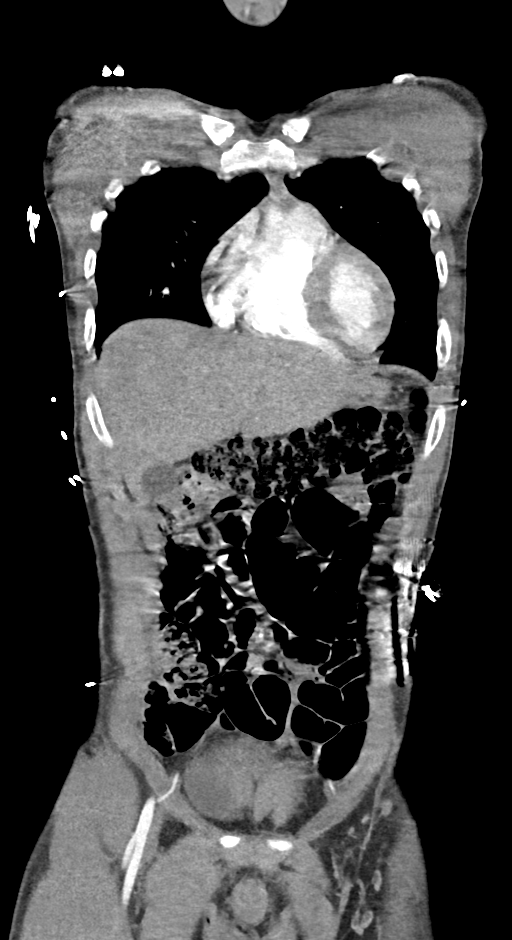
[im 69/138  soft-tissue]
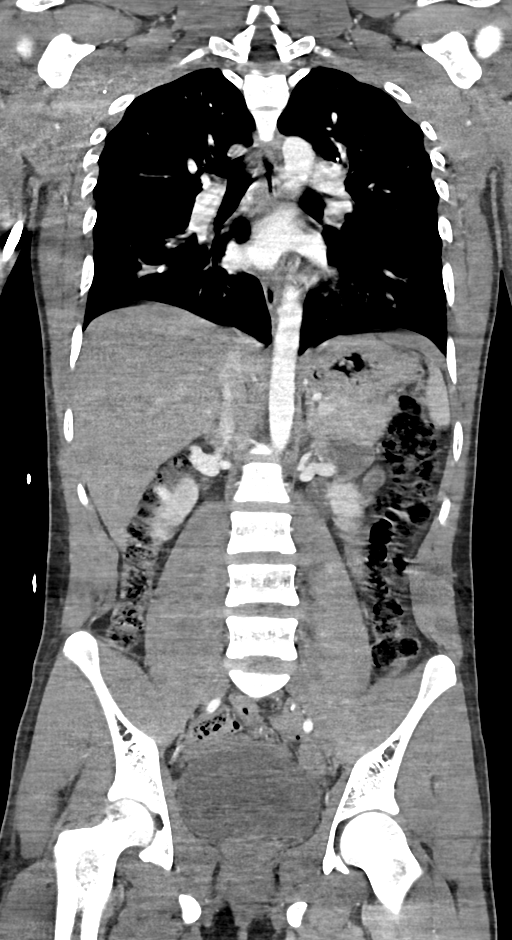
[im 103/138  soft-tissue]
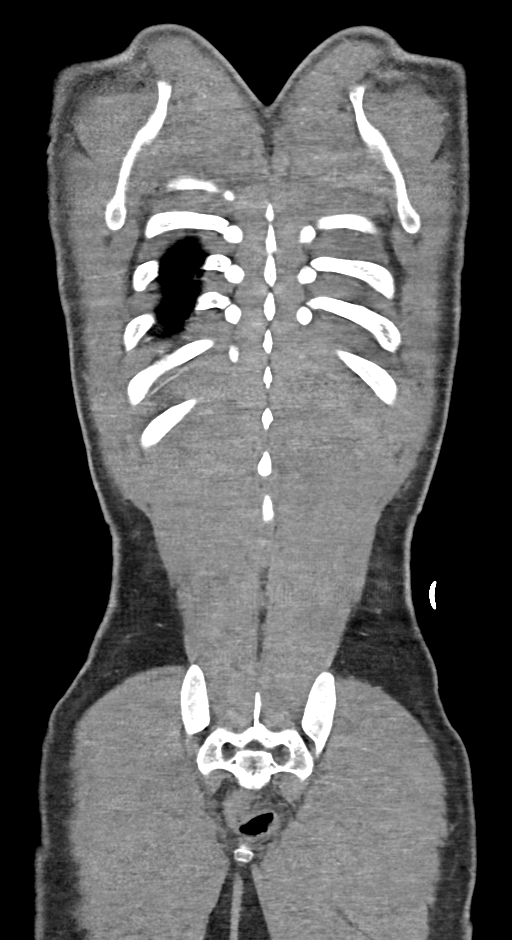

[13 of 46 positions shown; findings below may reference images not displayed]

Multidetector CT imaging through the chest, abdomen and pelvis was
performed using the standard protocol during bolus administration of
intravenous contrast. Multiplanar reconstructed images and MIPs were
obtained and reviewed to evaluate the vascular anatomy.

CONTRAST:  100mL OMNIPAQUE IOHEXOL 350 MG/ML SOLN
FINDINGS: CTA CHEST FINDINGS

Cardiovascular: The heart is not enlarged. There is no pericardial
effusion. There is no evidence of acute intramural hematoma or
aortic dissection. The major branch vessels in the chest are normal.

Mediastinum/Nodes: The thyroid is unremarkable. The esophagus is
grossly unremarkable. There is no mediastinal, hilar, or axillary
lymphadenopathy.

Lungs/Pleura: The trachea and central airways are patent.

The lungs are clear, with no focal consolidation or pulmonary edema.
There is no pleural effusion or pneumothorax.

Musculoskeletal: There is no acute osseous abnormality or aggressive
osseous lesion.

Review of the MIP images confirms the above findings.

CTA ABDOMEN AND PELVIS FINDINGS

VASCULAR

Aorta: Normal caliber aorta without aneurysm, dissection, vasculitis
or significant stenosis.

Celiac: Patent without evidence of aneurysm, dissection, vasculitis
or significant stenosis.

SMA: Patent without evidence of aneurysm, dissection, vasculitis or
significant stenosis.

Renals: Both renal arteries are patent without evidence of aneurysm,
dissection, vasculitis, fibromuscular dysplasia or significant
stenosis.

IMA: Patent without evidence of aneurysm, dissection, vasculitis or
significant stenosis.

Inflow: Patent without evidence of aneurysm, dissection, vasculitis
or significant stenosis.

Veins: No obvious venous abnormality within the limitations of this
arterial phase study.

Review of the MIP images confirms the above findings.

NON-VASCULAR

Hepatobiliary: The liver and gallbladder are unremarkable. There is
no biliary ductal dilatation.

Pancreas: Unremarkable.

Spleen: Unremarkable.

Adrenals/Urinary Tract: The adrenals are unremarkable.

There is a punctate nonobstructing left upper pole renal stone. The
kidneys are otherwise unremarkable. There is no hydronephrosis or
hydroureter. The bladder is unremarkable.

Stomach/Bowel: The stomach is unremarkable. There is no evidence of
bowel obstruction. There is no abnormal bowel wall thickening or
inflammatory change. The appendix is normal.

Lymphatic: There is no abdominal or pelvic lymphadenopathy.

Reproductive: The prostate and seminal vesicles are unremarkable.

Other: There is no ascites or free air.

Musculoskeletal: There is no acute osseous abnormality or aggressive
osseous lesion.

Review of the MIP images confirms the above findings.
IMPRESSION: 1. No acute findings in the chest, abdomen, or pelvis. No evidence
of aortic dissection.
2. Punctate nonobstructing left renal stone.

## 2021-04-06 MED ORDER — SODIUM CHLORIDE 0.9 % IV BOLUS
1000.0000 mL | Freq: Once | INTRAVENOUS | Status: AC
  Administered 2021-04-06: 14:00:00 1000 mL via INTRAVENOUS

## 2021-04-06 MED ORDER — NOREPINEPHRINE 4 MG/250ML-% IV SOLN
INTRAVENOUS | Status: AC
  Filled 2021-04-06: qty 250

## 2021-04-06 MED ORDER — MORPHINE SULFATE (PF) 4 MG/ML IV SOLN
4.0000 mg | Freq: Once | INTRAVENOUS | Status: AC
  Administered 2021-04-06: 14:00:00 4 mg via INTRAVENOUS
  Filled 2021-04-06: qty 1

## 2021-04-06 MED ORDER — SODIUM CHLORIDE 0.9 % IV BOLUS
1000.0000 mL | Freq: Once | INTRAVENOUS | Status: AC
  Administered 2021-04-06: 18:00:00 1000 mL via INTRAVENOUS

## 2021-04-06 MED ORDER — CHLORHEXIDINE GLUCONATE 0.12% ORAL RINSE (MEDLINE KIT)
15.0000 mL | Freq: Two times a day (BID) | OROMUCOSAL | Status: DC
  Administered 2021-04-07 – 2021-04-09 (×4): 15 mL via OROMUCOSAL

## 2021-04-06 MED ORDER — ACETAMINOPHEN 325 MG PO TABS
650.0000 mg | ORAL_TABLET | Freq: Four times a day (QID) | ORAL | Status: DC | PRN
  Administered 2021-04-07 – 2021-04-08 (×5): 650 mg via ORAL
  Filled 2021-04-06 (×6): qty 2

## 2021-04-06 MED ORDER — ADULT MULTIVITAMIN W/MINERALS CH
1.0000 | ORAL_TABLET | Freq: Every day | ORAL | Status: DC
  Administered 2021-04-06 – 2021-04-09 (×4): 1 via ORAL
  Filled 2021-04-06 (×4): qty 1

## 2021-04-06 MED ORDER — GADOBUTROL 1 MMOL/ML IV SOLN
7.0000 mL | Freq: Once | INTRAVENOUS | Status: AC | PRN
  Administered 2021-04-06: 21:00:00 7 mL via INTRAVENOUS

## 2021-04-06 MED ORDER — VANCOMYCIN HCL 1000 MG/200ML IV SOLN
1000.0000 mg | Freq: Three times a day (TID) | INTRAVENOUS | Status: DC
  Administered 2021-04-07 (×2): 1000 mg via INTRAVENOUS
  Filled 2021-04-06 (×3): qty 200

## 2021-04-06 MED ORDER — ONDANSETRON HCL 4 MG PO TABS: 4.0000 mg | ORAL_TABLET | Freq: Four times a day (QID) | ORAL | Status: DC | PRN

## 2021-04-06 MED ORDER — SODIUM CHLORIDE 0.9 % IV SOLN: 75.0000 mL/h | INTRAVENOUS | Status: DC

## 2021-04-06 MED ORDER — ACETAMINOPHEN 650 MG RE SUPP: 650.0000 mg | Freq: Four times a day (QID) | RECTAL | Status: DC | PRN

## 2021-04-06 MED ORDER — DEXMEDETOMIDINE HCL IN NACL 400 MCG/100ML IV SOLN
0.2000 ug/kg/h | INTRAVENOUS | Status: DC
  Administered 2021-04-06: 19:00:00 0.4 ug/kg/h via INTRAVENOUS
  Filled 2021-04-06 (×2): qty 100

## 2021-04-06 MED ORDER — THIAMINE HCL 100 MG PO TABS
100.0000 mg | ORAL_TABLET | Freq: Every day | ORAL | Status: DC
  Administered 2021-04-07 – 2021-04-09 (×3): 100 mg via ORAL
  Filled 2021-04-06 (×2): qty 1

## 2021-04-06 MED ORDER — FOLIC ACID 1 MG PO TABS
1.0000 mg | ORAL_TABLET | Freq: Every day | ORAL | Status: DC
  Administered 2021-04-06 – 2021-04-09 (×4): 1 mg via ORAL
  Filled 2021-04-06 (×4): qty 1

## 2021-04-06 MED ORDER — LORAZEPAM 1 MG PO TABS: 1.0000 mg | ORAL_TABLET | ORAL | Status: DC | PRN

## 2021-04-06 MED ORDER — SODIUM CHLORIDE 0.9 % IV SOLN: INTRAVENOUS | Status: DC

## 2021-04-06 MED ORDER — ACETAMINOPHEN 500 MG PO TABS
1000.0000 mg | ORAL_TABLET | Freq: Once | ORAL | Status: AC
  Administered 2021-04-06: 16:00:00 1000 mg via ORAL
  Filled 2021-04-06: qty 2

## 2021-04-06 MED ORDER — SODIUM CHLORIDE 0.9 % IV SOLN: 90.0000 mg | Freq: Four times a day (QID) | INTRAVENOUS | Status: DC

## 2021-04-06 MED ORDER — SODIUM CHLORIDE 0.9 % IV SOLN
2.0000 g | Freq: Two times a day (BID) | INTRAVENOUS | Status: DC
  Administered 2021-04-07: 2 g via INTRAVENOUS
  Filled 2021-04-06 (×2): qty 20

## 2021-04-06 MED ORDER — DEXTROSE 5 % IV SOLN
10.0000 mg/kg | Freq: Three times a day (TID) | INTRAVENOUS | Status: DC
  Administered 2021-04-06 – 2021-04-07 (×3): 655 mg via INTRAVENOUS
  Filled 2021-04-06 (×5): qty 13.1

## 2021-04-06 MED ORDER — ONDANSETRON HCL 4 MG/2ML IJ SOLN: 4.0000 mg | Freq: Four times a day (QID) | INTRAMUSCULAR | Status: DC | PRN

## 2021-04-06 MED ORDER — DROPERIDOL 2.5 MG/ML IJ SOLN
2.5000 mg | Freq: Once | INTRAMUSCULAR | Status: AC
  Administered 2021-04-06: 16:00:00 2.5 mg via INTRAVENOUS
  Filled 2021-04-06: qty 2

## 2021-04-06 MED ORDER — IOHEXOL 350 MG/ML SOLN
100.0000 mL | Freq: Once | INTRAVENOUS | Status: AC | PRN
  Administered 2021-04-06: 15:00:00 100 mL via INTRAVENOUS

## 2021-04-06 MED ORDER — MAGNESIUM SULFATE 4 GM/100ML IV SOLN
4.0000 g | Freq: Once | INTRAVENOUS | Status: DC
Start: 2021-04-06 — End: 2021-04-06

## 2021-04-06 MED ORDER — HYDROMORPHONE HCL 1 MG/ML IJ SOLN: 1.0000 mg | Freq: Once | INTRAMUSCULAR | Status: DC

## 2021-04-06 MED ORDER — MAGNESIUM SULFATE 2 GM/50ML IV SOLN
2.0000 g | Freq: Once | INTRAVENOUS | Status: AC
  Administered 2021-04-06: 16:00:00 2 g via INTRAVENOUS
  Filled 2021-04-06: qty 50

## 2021-04-06 MED ORDER — MAGNESIUM SULFATE 2 GM/50ML IV SOLN
2.0000 g | Freq: Once | INTRAVENOUS | Status: AC
  Administered 2021-04-06: 22:00:00 2 g via INTRAVENOUS
  Filled 2021-04-06: qty 50

## 2021-04-06 MED ORDER — CEFTRIAXONE SODIUM 1 G IJ SOLR
1.0000 g | Freq: Once | INTRAMUSCULAR | Status: AC
  Administered 2021-04-06: 17:00:00 1 g via INTRAVENOUS
  Filled 2021-04-06: qty 10

## 2021-04-06 MED ORDER — LORAZEPAM 2 MG/ML IJ SOLN
INTRAMUSCULAR | Status: AC
  Filled 2021-04-06: qty 1

## 2021-04-06 MED ORDER — ONDANSETRON HCL 4 MG/2ML IJ SOLN
4.0000 mg | Freq: Once | INTRAMUSCULAR | Status: AC
  Administered 2021-04-06: 14:00:00 4 mg via INTRAVENOUS
  Filled 2021-04-06: qty 2

## 2021-04-06 MED ORDER — OSELTAMIVIR PHOSPHATE 75 MG PO CAPS
75.0000 mg | ORAL_CAPSULE | Freq: Two times a day (BID) | ORAL | Status: DC
  Administered 2021-04-06 – 2021-04-09 (×6): 75 mg via ORAL
  Filled 2021-04-06 (×9): qty 1

## 2021-04-06 MED ORDER — ORAL CARE MOUTH RINSE
15.0000 mL | OROMUCOSAL | Status: DC
  Administered 2021-04-07 – 2021-04-08 (×8): 15 mL via OROMUCOSAL

## 2021-04-06 MED ORDER — THIAMINE HCL 100 MG/ML IJ SOLN
100.0000 mg | Freq: Every day | INTRAMUSCULAR | Status: DC
  Administered 2021-04-06: 22:00:00 100 mg via INTRAVENOUS
  Filled 2021-04-06: qty 2

## 2021-04-06 MED ORDER — POTASSIUM CHLORIDE 10 MEQ/100ML IV SOLN
10.0000 meq | INTRAVENOUS | Status: AC
  Administered 2021-04-06 – 2021-04-07 (×3): 10 meq via INTRAVENOUS
  Filled 2021-04-06 (×2): qty 100

## 2021-04-06 MED ORDER — ENOXAPARIN SODIUM 40 MG/0.4ML IJ SOSY
40.0000 mg | PREFILLED_SYRINGE | INTRAMUSCULAR | Status: DC
  Filled 2021-04-06: qty 0.4

## 2021-04-06 MED ORDER — VANCOMYCIN HCL 1500 MG/300ML IV SOLN
1500.0000 mg | Freq: Once | INTRAVENOUS | Status: AC
  Administered 2021-04-06: 17:00:00 1500 mg via INTRAVENOUS
  Filled 2021-04-06: qty 300

## 2021-04-06 MED ORDER — NOREPINEPHRINE 4 MG/250ML-% IV SOLN
0.0000 ug/min | INTRAVENOUS | Status: DC
  Administered 2021-04-06: 19:00:00 5 ug/min via INTRAVENOUS

## 2021-04-06 MED ORDER — SODIUM CHLORIDE 0.9 % IV SOLN
1.0000 g | Freq: Once | INTRAVENOUS | Status: AC
  Administered 2021-04-06: 16:00:00 1 g via INTRAVENOUS
  Filled 2021-04-06: qty 10

## 2021-04-06 MED ORDER — LACTATED RINGERS IV SOLN: INTRAVENOUS | Status: DC

## 2021-04-06 MED ORDER — LORAZEPAM 2 MG/ML IJ SOLN: 1.0000 mg | INTRAMUSCULAR | Status: DC | PRN

## 2021-04-06 NOTE — Progress Notes (Signed)
Pharmacy Antibiotic Note  Eric Hudson is a 20 y.o. male admitted on 04/06/2021 with seizure-like activity and possible meningitis. WBC 11.6 and Tmax 102.4 F. Patient reports that he had multiple childhood vaccines [facility] about 2 years ago. No recent sick contacts. LP attempted but unsuccessful. In the ED, started on empiric meningitis coverage on ceftriaxone, acyclovir and vancomycin. Pharmacy has been consulted for vancomycin dosing.  Plan: Vancomycin 1500mg  x 1 load  Vancomycin 1000mg  q8h Obtain vancomycin trough as needed Increase ceftriaxone dose to 2g q12h  Acyclovir 655mg  q8h with LR F/u renal function, BMET, and Cx   Temp (24hrs), Avg:101.4 F (38.6 C), Min:100.3 F (37.9 C), Max:102.4 F (39.1 C)  Recent Labs  Lab 04/06/21 1240  WBC 11.6*  CREATININE 1.02    CrCl cannot be calculated (Unknown ideal weight.).    Not on File  Antimicrobials this admission: Ceftriaxone 11/17 > Vancomycin 11/17 > Acyclovir 11/17 >  Dose adjustments this admission:  Microbiology results: 11/17 BCx: sent 11/17 flu: positive  Thank you for allowing pharmacy to participate in this patient's care.  12/17, PharmD PGY1 Acute Care Resident  04/06/2021,7:21 PM

## 2021-04-06 NOTE — ED Notes (Signed)
Rn aware of pt BP

## 2021-04-06 NOTE — ED Notes (Signed)
Precedex Started at 0.4 at 1911.

## 2021-04-06 NOTE — ED Notes (Signed)
Pt started on levophed at 61mcg/min Per Dr. Merrily Pew.

## 2021-04-06 NOTE — ED Provider Notes (Signed)
[facility] EMERGENCY DEPARTMENT Provider Note   CSN: [id number] Arrival date & time: 04/06/21  1227     History Chief Complaint  Patient presents with   Seizures    Ewing Fandino is a 20 y.o. male.  This is a 20 y.o. male without known significant medical history who presents to the ED with complaint of possible seizure activity, abdominal/back pain. Pt poor historian, spanish speaking - language interpreter was utilized. Per pt and friend at bedside he was having some ongoing back pain, RUQ pain since this morning. Difficulty getting into his truck. Sharp, stabbing pain in his b/l flanks, RUQ. No nausea or vomiting, no fevers or chills reported, no changed to bowel/bladder fxn. No illicit drug use reported. He felt very hot and unsure what happened after that, thinks he might have passed out. Friend at bedside said pt was frothing at the mouth, not breathing for a few moments, clenching his hands b/l, jerking torso rhythmically. Resolved after 30 seconds or so, pt was confused after. Was not incontinent, no head injury as friend was holding pt in his lap for duration of symptoms. This occurred 2x prior to arrival per pt's friend   Level 5 caveat, AMS/delirium  The history is provided by the patient and a friend. The history is limited by the condition of the patient and a language barrier. A language interpreter was used.  Seizures     History reviewed. No pertinent past medical history.  There are no problems to display for this patient.   History reviewed. No pertinent surgical history.     History reviewed. No pertinent family history.     Home Medications Prior to Admission medications   Not on File    Allergies    Patient has no allergy information on record.  Review of Systems   Review of Systems  Unable to perform ROS: Acuity of condition  Constitutional:  Positive for diaphoresis and fatigue.  Gastrointestinal:  Positive for abdominal pain  and nausea.  Musculoskeletal:  Positive for back pain.  Neurological:  Positive for seizures.   Physical Exam Updated Vital Signs BP 125/64   Pulse (!) 124   Temp (!) 102.4 F (39.1 C) (Rectal)   Resp (!) 23   Wt 65.3 kg   SpO2 100%   Physical Exam Vitals and nursing note reviewed.  Constitutional:      General: He is not in acute distress.    Appearance: Normal appearance. He is well-developed.  HENT:     Head: Normocephalic and atraumatic.     Right Ear: External ear normal.     Left Ear: External ear normal.     Mouth/Throat:     Mouth: Mucous membranes are moist.  Eyes:     General: No scleral icterus.    Extraocular Movements: Extraocular movements intact.     Pupils: Pupils are equal, round, and reactive to light.  Cardiovascular:     Rate and Rhythm: Normal rate and regular rhythm.     Pulses: Normal pulses.     Heart sounds: Normal heart sounds.  Pulmonary:     Effort: Pulmonary effort is normal. Tachypnea present. No respiratory distress.     Breath sounds: Normal breath sounds.  Abdominal:     General: Abdomen is flat.     Palpations: Abdomen is soft.     Tenderness: There is abdominal tenderness.     Comments: Diffusely tender entire abdomen/flanks. Not rigid. Not peritoneal.   Musculoskeletal:  General: Normal range of motion.     Cervical back: Normal range of motion.     Right lower leg: No edema.     Left lower leg: No edema.  Skin:    General: Skin is warm and dry.     Capillary Refill: Capillary refill takes less than 2 seconds.  Neurological:     Mental Status: He is alert. He is disoriented.     GCS: GCS eye subscore is 4. GCS verbal subscore is 4. GCS motor subscore is 6.     Cranial Nerves: Cranial nerves 2-12 are intact.     Sensory: Sensation is intact.     Motor: Tremor present.     Comments: Variable compliance with neuro exam, concern for poor effort. Language barrier makes verbal status difficult assess as interpretation was  through translator.    Psychiatric:        Mood and Affect: Mood normal.        Behavior: Behavior normal.    ED Results / Procedures / Treatments   Labs (all labs ordered are listed, but only abnormal results are displayed) Labs Reviewed  COMPREHENSIVE METABOLIC PANEL - Abnormal; Notable for the following components:      Result Value   Sodium 131 (*)    Potassium 3.4 (*)    Chloride 97 (*)    Glucose, Bld 100 (*)    All other components within normal limits  CBC WITH DIFFERENTIAL/PLATELET - Abnormal; Notable for the following components:   WBC 11.6 (*)    Neutro Abs 9.9 (*)    All other components within normal limits  MAGNESIUM - Abnormal; Notable for the following components:   Magnesium 1.5 (*)    All other components within normal limits  SALICYLATE LEVEL - Abnormal; Notable for the following components:   Salicylate Lvl <7.0 (*)    All other components within normal limits  ACETAMINOPHEN LEVEL - Abnormal; Notable for the following components:   Acetaminophen (Tylenol), Serum <10 (*)    All other components within normal limits  I-STAT VENOUS BLOOD GAS, ED - Abnormal; Notable for the following components:   pH, Ven 7.504 (*)    pCO2, Ven 31.0 (*)    Potassium 3.3 (*)    Calcium, Ion 1.09 (*)    All other components within normal limits  RESP PANEL BY RT-PCR (FLU A&B, COVID) ARPGX2  CULTURE, BLOOD (ROUTINE X 2)  CULTURE, BLOOD (ROUTINE X 2)  CK  LIPASE, BLOOD  RAPID URINE DRUG SCREEN, HOSP PERFORMED  URINALYSIS, ROUTINE W REFLEX MICROSCOPIC  OSMOLALITY  BLOOD GAS, VENOUS  VOLATILES,BLD-ACETONE,ETHANOL,ISOPROP,METHANOL  CBG MONITORING, ED  CBG MONITORING, ED    EKG EKG Interpretation  Date/Time:  Thursday April 06 2021 12:32:02 EST Ventricular Rate:  88 PR Interval:  147 QRS Duration: 98 QT Interval:  321 QTC Calculation: 389 R Axis:   89 Text Interpretation: Sinus rhythm ST elev, probable normal early repol pattern No prior tracing Confirmed by  Tanda Rockers ([id number]) on 04/06/2021 2:27:14 PM  Radiology CT HEAD WO CONTRAST ( )  Result Date: 04/06/2021 CLINICAL DATA:  First-time seizure EXAM: CT HEAD WITHOUT CONTRAST TECHNIQUE: Contiguous axial images were obtained from the base of the skull through the vertex without intravenous contrast. COMPARISON:  None FINDINGS: Brain: There is no evidence of acute intracranial hemorrhage, extra-axial fluid collection, or acute infarct. Parenchymal volume is normal. The ventricles are normal in size. There is no mass lesion. There is no midline shift. Vascular: No hyperdense vessel or  unexpected calcification. Skull: Normal. Negative for fracture or focal lesion. Sinuses/Orbits: The paranasal sinuses are clear. The globes and orbits are unremarkable. Other: None. IMPRESSION: Normal head CT. Electronically Signed   By: Lesia Hausen M.D.   On: 04/06/2021 14:53   CT Angio Chest/Abd/Pel for Dissection W and/or Wo Contrast  Result Date: 04/06/2021 CLINICAL DATA:  Chest pain, back pain EXAM: CT ANGIOGRAPHY CHEST, ABDOMEN AND PELVIS TECHNIQUE: Non-contrast CT of the chest was initially obtained. Multidetector CT imaging through the chest, abdomen and pelvis was performed using the standard protocol during bolus administration of intravenous contrast. Multiplanar reconstructed images and MIPs were obtained and reviewed to evaluate the vascular anatomy. CONTRAST:  OMNIPAQUE IOHEXOL 350 MG/ML SOLN COMPARISON:  None. FINDINGS: CTA CHEST FINDINGS Cardiovascular: The heart is not enlarged. There is no pericardial effusion. There is no evidence of acute intramural hematoma or aortic dissection. The major branch vessels in the chest are normal. Mediastinum/Nodes: The thyroid is unremarkable. The esophagus is grossly unremarkable. There is no mediastinal, hilar, or axillary lymphadenopathy. Lungs/Pleura: The trachea and central airways are patent. The lungs are clear, with no focal consolidation or pulmonary edema. There  is no pleural effusion or pneumothorax. Musculoskeletal: There is no acute osseous abnormality or aggressive osseous lesion. Review of the MIP images confirms the above findings. CTA ABDOMEN AND PELVIS FINDINGS VASCULAR Aorta: Normal caliber aorta without aneurysm, dissection, vasculitis or significant stenosis. Celiac: Patent without evidence of aneurysm, dissection, vasculitis or significant stenosis. SMA: Patent without evidence of aneurysm, dissection, vasculitis or significant stenosis. Renals: Both renal arteries are patent without evidence of aneurysm, dissection, vasculitis, fibromuscular dysplasia or significant stenosis. IMA: Patent without evidence of aneurysm, dissection, vasculitis or significant stenosis. Inflow: Patent without evidence of aneurysm, dissection, vasculitis or significant stenosis. Veins: No obvious venous abnormality within the limitations of this arterial phase study. Review of the MIP images confirms the above findings. NON-VASCULAR Hepatobiliary: The liver and gallbladder are unremarkable. There is no biliary ductal dilatation. Pancreas: Unremarkable. Spleen: Unremarkable. Adrenals/Urinary Tract: The adrenals are unremarkable. There is a punctate nonobstructing left upper pole renal stone. The kidneys are otherwise unremarkable. There is no hydronephrosis or hydroureter. The bladder is unremarkable. Stomach/Bowel: The stomach is unremarkable. There is no evidence of bowel obstruction. There is no abnormal bowel wall thickening or inflammatory change. The appendix is normal. Lymphatic: There is no abdominal or pelvic lymphadenopathy. Reproductive: The prostate and seminal vesicles are unremarkable. Other: There is no ascites or free air. Musculoskeletal: There is no acute osseous abnormality or aggressive osseous lesion. Review of the MIP images confirms the above findings. IMPRESSION: 1. No acute findings in the chest, abdomen, or pelvis. No evidence of aortic dissection. 2. Punctate  nonobstructing left renal stone. Electronically Signed   By: Lesia Hausen M.D.   On: 04/06/2021 15:04    Procedures Ultrasound ED Abd  Date/Time: 04/06/2021 2:25 PM Performed by: Sloan Leiter, DO Authorized by: Sloan Leiter, DO   Procedure details:    Indications: abdominal pain, back pain and flank pain     Assessment for:  Hydronephrosis, gallstones, kidney stones, appendicitis, AAA and mass   Aorta:  Visualized   Left renal:  Visualized   Right renal:  Visualized   Hepatobiliary:  Visualized   Bladder:  Visualized    Images: not archived   Study Limitations: patient compliance Vascular findings:    Aorta: aorta normal (< 3cm)   Left renal findings:    Intra-abdominal fluid: not identified  Perinephric fluid: not identified   Right renal findings:    Intra-abdominal fluid: not identified     Perinephric fluid: not identified   Hepatobiliary findings:    Gallbladder stones: identified     Sonographic Murphy's sign: positive   Bladder findings:    Free pelvic fluid: not identified   .Critical Care Performed by: Sloan Leiter, DO Authorized by: Sloan Leiter, DO   Critical care provider statement:    Critical care time (minutes):  42   Critical care time was exclusive of:  Separately billable procedures and treating other patients   Critical care was necessary to treat or prevent imminent or life-threatening deterioration of the following conditions:  Sepsis   Critical care was time spent personally by me on the following activities:  Development of treatment plan with patient or surrogate, discussions with consultants, evaluation of patient's response to treatment, examination of patient, ordering and review of laboratory studies, ordering and review of radiographic studies, ordering and performing treatments and interventions, pulse oximetry, re-evaluation of patient's condition and review of old charts   Care discussed with: admitting provider     Medications  Ordered in ED Medications  sodium chloride 0.9 % bolus 1,000 mL (1,000 mLs Intravenous New Bag/Given 04/06/21 1335)    And  0.9 %  sodium chloride infusion ( Intravenous New Bag/Given 04/06/21 1457)  magnesium sulfate IVPB 2 g 50 mL (2 g Intravenous New Bag/Given 04/06/21 1555)  vancomycin (VANCOREADY) IVPB 1500 mg/300 mL (has no administration in time range)  cefTRIAXone (ROCEPHIN) 1 g in sodium chloride 0.9 % 100 mL IVPB (1 g Intravenous New Bag/Given 04/06/21 1546)  morphine 4 MG/ML injection 4 mg (4 mg Intravenous Given 04/06/21 1332)  ondansetron (ZOFRAN) injection 4 mg (4 mg Intravenous Given 04/06/21 1332)  morphine 4 MG/ML injection 4 mg (4 mg Intravenous Given 04/06/21 1405)  iohexol (OMNIPAQUE) 350 MG/ML injection 100 mL (100 mLs Intravenous Contrast Given 04/06/21 1439)  LORazepam (ATIVAN) 2 MG/ML injection (  Given 04/06/21 1512)  acetaminophen (TYLENOL) tablet 1,000 mg (1,000 mg Oral Given 04/06/21 1553)  droperidol (INAPSINE) 2.5 MG/ML injection 2.5 mg (2.5 mg Intravenous Given 04/06/21 1550)    ED Course  I have reviewed the triage vital signs and the nursing notes.  Pertinent labs & imaging results that were available during my care of the patient were reviewed by me and considered in my medical decision making (see chart for details).  Clinical Course as of 04/06/21 1616  Thu Apr 06, 2021  1519 Pt acutely altered, febrile. There is concern for possible meningitis. Pt unable to sit still, unable to tolerate LP despite analgesics, benzos. CTH negative. Will empirically treat with vanco/rocephin.  [SG]    Clinical Course User Index [SG] Sloan Leiter, DO   MDM Rules/Calculators/A&P                           CC: pos seizure, back/abd pain  This patient complains of above; this involves an extensive number of treatment options and is a complaint that carries with it a high risk of complications and morbidity. Vital signs were reviewed. Serious etiologies  considered.  Record review:  Previous records obtained and reviewed   Additional history obtained from friend at bedside  Work up as above, notable for:  Labs & imaging results that were available during my care of the patient were reviewed by me and considered in my medical decision making.  I ordered imaging studies which included CTH, CT dissection study and I independently visualized and interpreted imaging which showed no acute process  Bedside POCUS exam does show some gallstones, +murphy sign sonographic, no free intraperitoneal fluid, no large pericardial effusion.  Pt remains in acute discomfort, give further analgesics. Pain to his entire body diffusely.   Pt with episode of what friend felt was seizure activity earlier, does not appear to be typical seizure; pt clenching fists and shouting at arms and legs due to discomfort, drooling intermittently when shouting/gritting teeth.   D/w friend at bedside; adamant no stimulant, methamphetamines, no bath salts, no illicit drug use. No external evidence of IVDU.   Management: Pt given morphine 4mg  x2, ativan, droperidol. Pt remains altered. Mentation will transiently improve then deteriorate again. Protecting his airway. CTH negative. CT dissection negative. IVF in process.  Mg is low, replaced IV.     Pt remains altered, agitated delirium; mildly improved after intervention including droperidol. Patient unable to be positioned appropriately for LP. Do not feel it would be safe to attempt this procedure in pt's current status. Empiric abx started for presumed meningitis. Consult placed to IR for evaluation for LP under sedation. D/w Hospitalist who accepts pt for admission.          This chart was dictated using voice recognition software.  Despite best efforts to proofread,  errors can occur which can change the documentation meaning.  Final Clinical Impression(s) / ED Diagnoses Final diagnoses:  Delirium  Seizure-like  activity (HCC)  Hypomagnesemia  Sepsis, due to unspecified organism, unspecified whether acute organ dysfunction present [facility])  Hypokalemia    Rx / DC Orders ED Discharge Orders     None        [facility], DO 04/06/21 1616

## 2021-04-06 NOTE — H&P (Signed)
History and Physical    Eric Hudson GHW:[id number] DOB: 2001-05-19 DOA: 04/06/2021  PCP: Default, Provider, MD (Confirm with patient/family/NH records and if not entered, this has to be entered at [facility] point of entry) Patient coming from: Home  I have personally briefly reviewed patient's old medical records in [facility]  Chief Complaint: Pain everywhere  HPI: Eric Hudson is a 20 y.o. male with no significant past medical history came with new onset of seizure and fever.  Patient is Spanish-speaking, tele interpreter was used.  Patient reported that he started to feel "sick" yesterday, but he cannot specify.  But he denied any headache, fever chills cough, urinary problem or abdominal pain or diarrhea yesterday.  This morning, patient woke up and went to work, he still feel a little malaise, and started to develop a new back pain and pain when urinated.  While around noon time, patient sudden fell down and "appears whole body was shaking and arms twitching, hand clenching and foam in his mouth" lasted about 1 minutes. Then patient recovered consciousness but remained confused.   Patient denies any IV drug use, he broke with his only girl friend last week, no any sexual contact in >2 weeks.  ED Course: Spiked fever of 102.5, BP trending low.   ED physician planned for LP, but patient was too agitated after repeated doses of morphine and Ativan.  Empirical antibiotics of vancomycin and cefadroxil started in the ED.  Review of Systems: Unable to perform, patient confused and agitated.  History reviewed. No pertinent past medical history.  History reviewed. No pertinent surgical history.   has no history on file for tobacco use, alcohol use, and drug use.  Not on File  History reviewed. No pertinent family history.   Prior to Admission medications   Not on File    Physical Exam: Vitals:   04/06/21 1530 04/06/21 1600 04/06/21 1630 04/06/21 1635  BP: (!) 123/58 120/62  (!) 101/54   Pulse: (!) 109 (!) 106 (!) 105   Resp: (!) 29 20 (!) 32   Temp:      TempSrc:      SpO2: 100% 100% 100%   Weight:      Height:    6\' 1"  (1.854 m)    Constitutional: NAD, calm, comfortable Vitals:   04/06/21 1530 04/06/21 1600 04/06/21 1630 04/06/21 1635  BP: (!) 123/58 120/62 (!) 101/54   Pulse: (!) 109 (!) 106 (!) 105   Resp: (!) 29 20 (!) 32   Temp:      TempSrc:      SpO2: 100% 100% 100%   Weight:      Height:    6\' 1"  (1.854 m)   Eyes: PERRL, lids and conjunctivae normal ENMT: Mucous membranes are dry. Posterior pharynx clear of any exudate or lesions.Normal dentition.  Neck: normal, supple, no masses, no thyromegaly Respiratory: clear to auscultation bilaterally, no wheezing, no crackles. Normal respiratory effort. No accessory muscle use.  Cardiovascular: Regular rate and rhythm, no murmurs / rubs / gallops. No extremity edema. 2+ pedal pulses. No carotid bruits.  Abdomen: Right CVA tenderness, no masses palpated. No hepatosplenomegaly. Bowel sounds positive.  Musculoskeletal: no clubbing / cyanosis. No joint deformity upper and lower extremities. Good ROM, no contractures. Normal muscle tone.  Skin: no rashes, lesions, ulcers. No induration Neurologic: No facial droops, moving all limbs, following simple commands. Psychiatric: Lethargic, arousable, confused and episode of agitation  Labs on Admission: I have personally reviewed following labs and  imaging studies  CBC: Recent Labs  Lab 04/06/21 1240 04/06/21 1317  WBC 11.6*  --   NEUTROABS 9.9*  --   HGB 15.1 16.0  HCT 45.0 47.0  MCV 86.2  --   PLT 376  --    Basic Metabolic Panel: Recent Labs  Lab 04/06/21 1240 04/06/21 1317  NA 131* 135  K 3.4* 3.3*  CL 97*  --   CO2 22  --   GLUCOSE 100*  --   BUN 16  --   CREATININE 1.02  --   CALCIUM 9.8  --   MG 1.5*  --    GFR: Estimated Creatinine Clearance: 107.6 mL/min (by C-G formula based on SCr of 1.02 mg/dL). Liver Function  Tests: Recent Labs  Lab 04/06/21 1240  AST 29  ALT 29  ALKPHOS 91  BILITOT 0.6  PROT 7.8  ALBUMIN 4.4   Recent Labs  Lab 04/06/21 1240  LIPASE 27   No results for input(s): AMMONIA in the last 168 hours. Coagulation Profile: No results for input(s): INR, PROTIME in the last 168 hours. Cardiac Enzymes: Recent Labs  Lab 04/06/21 1240  CKTOTAL 306   BNP (last 3 results) No results for input(s): PROBNP in the last 8760 hours. HbA1C: No results for input(s): HGBA1C in the last 72 hours. CBG: Recent Labs  Lab 04/06/21 1312  GLUCAP 85   Lipid Profile: No results for input(s): CHOL, HDL, LDLCALC, TRIG, CHOLHDL, LDLDIRECT in the last 72 hours. Thyroid Function Tests: No results for input(s): TSH, T4TOTAL, FREET4, T3FREE, THYROIDAB in the last 72 hours. Anemia Panel: No results for input(s): VITAMINB12, FOLATE, FERRITIN, TIBC, IRON, RETICCTPCT in the last 72 hours. Urine analysis: No results found for: COLORURINE, APPEARANCEUR, LABSPEC, PHURINE, GLUCOSEU, HGBUR, BILIRUBINUR, KETONESUR, PROTEINUR, UROBILINOGEN, NITRITE, LEUKOCYTESUR  Radiological Exams on Admission: CT HEAD WO CONTRAST ( )  Result Date: 04/06/2021 CLINICAL DATA:  First-time seizure EXAM: CT HEAD WITHOUT CONTRAST TECHNIQUE: Contiguous axial images were obtained from the base of the skull through the vertex without intravenous contrast. COMPARISON:  None FINDINGS: Brain: There is no evidence of acute intracranial hemorrhage, extra-axial fluid collection, or acute infarct. Parenchymal volume is normal. The ventricles are normal in size. There is no mass lesion. There is no midline shift. Vascular: No hyperdense vessel or unexpected calcification. Skull: Normal. Negative for fracture or focal lesion. Sinuses/Orbits: The paranasal sinuses are clear. The globes and orbits are unremarkable. Other: None. IMPRESSION: Normal head CT. Electronically Signed   By: Lesia Hausen M.D.   On: 04/06/2021 14:53   CT Angio  Chest/Abd/Pel for Dissection W and/or Wo Contrast  Result Date: 04/06/2021 CLINICAL DATA:  Chest pain, back pain EXAM: CT ANGIOGRAPHY CHEST, ABDOMEN AND PELVIS TECHNIQUE: Non-contrast CT of the chest was initially obtained. Multidetector CT imaging through the chest, abdomen and pelvis was performed using the standard protocol during bolus administration of intravenous contrast. Multiplanar reconstructed images and MIPs were obtained and reviewed to evaluate the vascular anatomy. CONTRAST:  OMNIPAQUE IOHEXOL 350 MG/ML SOLN COMPARISON:  None. FINDINGS: CTA CHEST FINDINGS Cardiovascular: The heart is not enlarged. There is no pericardial effusion. There is no evidence of acute intramural hematoma or aortic dissection. The major branch vessels in the chest are normal. Mediastinum/Nodes: The thyroid is unremarkable. The esophagus is grossly unremarkable. There is no mediastinal, hilar, or axillary lymphadenopathy. Lungs/Pleura: The trachea and central airways are patent. The lungs are clear, with no focal consolidation or pulmonary edema. There is no pleural effusion or pneumothorax. Musculoskeletal:  There is no acute osseous abnormality or aggressive osseous lesion. Review of the MIP images confirms the above findings. CTA ABDOMEN AND PELVIS FINDINGS VASCULAR Aorta: Normal caliber aorta without aneurysm, dissection, vasculitis or significant stenosis. Celiac: Patent without evidence of aneurysm, dissection, vasculitis or significant stenosis. SMA: Patent without evidence of aneurysm, dissection, vasculitis or significant stenosis. Renals: Both renal arteries are patent without evidence of aneurysm, dissection, vasculitis, fibromuscular dysplasia or significant stenosis. IMA: Patent without evidence of aneurysm, dissection, vasculitis or significant stenosis. Inflow: Patent without evidence of aneurysm, dissection, vasculitis or significant stenosis. Veins: No obvious venous abnormality within the limitations  of this arterial phase study. Review of the MIP images confirms the above findings. NON-VASCULAR Hepatobiliary: The liver and gallbladder are unremarkable. There is no biliary ductal dilatation. Pancreas: Unremarkable. Spleen: Unremarkable. Adrenals/Urinary Tract: The adrenals are unremarkable. There is a punctate nonobstructing left upper pole renal stone. The kidneys are otherwise unremarkable. There is no hydronephrosis or hydroureter. The bladder is unremarkable. Stomach/Bowel: The stomach is unremarkable. There is no evidence of bowel obstruction. There is no abnormal bowel wall thickening or inflammatory change. The appendix is normal. Lymphatic: There is no abdominal or pelvic lymphadenopathy. Reproductive: The prostate and seminal vesicles are unremarkable. Other: There is no ascites or free air. Musculoskeletal: There is no acute osseous abnormality or aggressive osseous lesion. Review of the MIP images confirms the above findings. IMPRESSION: 1. No acute findings in the chest, abdomen, or pelvis. No evidence of aortic dissection. 2. Punctate nonobstructing left renal stone. Electronically Signed   By: Lesia Hausen M.D.   On: 04/06/2021 15:04    EKG: Independently reviewed.  Sinus tachycardia.  Assessment/Plan Principal Problem:   Sepsis (HCC)  (please populate well all problems here in Problem List. (For example, if patient is on BP meds at home and you resume or decide to hold them, it is a problem that needs to be her. Same for CAD, COPD, HLD and so on)  Severe sepsis, impending septic shock -Sepsis evidenced by fever, hypotension, evidence of endorgan damage of acute encephalopathy, suspicious infection source is CNS infection, rule out epidural abscess, rule out meningitis. -Discussed with on-call neurology Dr. Amada Jupiter, initial impression is to rule out epidural abscess before performing lumbar puncture. Neurology recommend MRI of brain/thoracic spine/lumbar spine with and without  contrast to rule out epidural abscess.  Neurology recommend hold off empirical steroid due to lack of meningeal signs this time. -Patient's blood pressure not stable after 4 L of IV bolus, discussed with ICU attending, who will see the patient emergently. -Continue empirical coverage for meningitis and viral encephalitis of vancomycin, ceftriaxone twice daily and acyclovir.    Acute metabolic encephalopathy, GCS=11 -Likely secondary to sepsis, less likely status epilepticus -UDS, and CIWA protocol with PRN benzos.  New onset of seizure -Versus other differentials such as rigors -Discussed with neurology, will monitor but not starting antiseizure medications.  Hypokalemia -IV replacement for now.  Back pain and dysuria -D/W on call radiologist, who reviewed the CT angiogram done this afternoon, there is no signs of pyelonephritis on either side. -UA pending, clinically there is no diffuse rash, but will send gonorrhea and chlamydia urine probe. -UDS pending.   DVT prophylaxis: Lovenox Code Status: Full code Family Communication: Sister at bedside Disposition Plan: Expect more than 2 mi Consults called: Neurology, Critical care Admission status: PCU   Emeline General MD [facility] Pager [phone] 04/06/2021, 5:05 PM

## 2021-04-06 NOTE — ED Notes (Signed)
Pt's BP trending down. MD made aware

## 2021-04-06 NOTE — ED Triage Notes (Signed)
Pt BIB [facility] from work place c/o seizures. Per EMS, pt had a witnessed seizure at the workplace, then coworker called EMS. Pt had another episode w EMS lasted for abt 1 minute. No known history.  126/84 Hr 112 sinus tach  Rr 16  spo2 99%  cbg 140

## 2021-04-06 NOTE — Discharge Instructions (Addendum)
Follow with Primary MD  in 7 days   Get CBC, CMP, 2 view Chest X ray -  checked next visit within 1 week by Primary MD   Activity: As tolerated with Full fall precautions use walker/cane & assistance as needed  Disposition Home    Diet: Heart Healthy    Special Instructions: If you have smoked or chewed Tobacco  in the last 2 yrs please stop smoking, stop any regular Alcohol  and or any Recreational drug use.  On your next visit with your primary care physician please Get Medicines reviewed and adjusted.  Please request your Prim.MD to go over all Hospital Tests and Procedure/Radiological results at the follow up, please get all Hospital records sent to your Prim MD by signing hospital release before you go home.  If you experience worsening of your admission symptoms, develop shortness of breath, life threatening emergency, suicidal or homicidal thoughts you must seek medical attention immediately by calling 911 or calling your MD immediately  if symptoms less severe.  You Must read complete instructions/literature along with all the possible adverse reactions/side effects for all the Medicines you take and that have been prescribed to you. Take any new Medicines after you have completely understood and accpet all the possible adverse reactions/side effects.      

## 2021-04-06 NOTE — Consult Note (Addendum)
Neurology Consultation  Reason for Consult: Concern for seizure, confusion, fever Referring Physician: Dr. Zhang  CC: Bilateral outer thigh pain earlier today that has since resolved  History is obtained from: Patient, Chart review  HPI: Eric Hudson is a 20 y.o. male with no significant medical history who presented to the ED 11/17 when patient had witnessed seizure-like activity while at work that lasted approximately one minute with complaints of lower back and bilateral thigh pain. Per patient's sister, patient has also been complaining of left head pain and intermittent left scalp numbness. A coworker at bedside states that today, the patient had been complaining of not feeling well this morning while at work and at lunch time he was sitting in the truck and began to yell and scream about his back and thigh pain and was acting confused. Coworkers then witnessed him have a syncopal event and possible seizure-like activity where he was frothing at the mouth, had respiratory arrest, clenched his hands bilaterally, and then began to have rhythmic jerking of his torso that occurred twice prior to hospital arrival. Patient's history of presentation is complicated with multiple different reported events, however, it does appear that he is confused from baseline, febrile, and had a syncopal event.  On arrival to the ED, he was found to be confused and febrile with a temperature of 102.4 F and tachycardic with a heart rate in the 120's.  Family at bedside denies that patient uses illicit substances or drinks alcohol.  ROS: Unable to obtain due to altered mental status the patient states that he feels back to normal.  History reviewed. No pertinent past medical history.  History reviewed. No pertinent family history.  Social History:   has no history on file for tobacco use, alcohol use, and drug use.  Medications  Current Facility-Administered Medications:    [COMPLETED] sodium chloride 0.9 %  bolus 1,000 mL, 1,000 mL, Intravenous, Once, Last Rate: 999 mL/hr at 04/06/21 1335, 1,000 mL at 04/06/21 1335 **AND** 0.9 %  sodium chloride infusion, , Intravenous, Continuous, Gray, Samuel A, DO, Last Rate: 125 mL/hr at 04/06/21 1457, New Bag at 04/06/21 1457   0.9 %  sodium chloride infusion, 75 mL/hr, Intravenous, Continuous, Zhang, Ping T, MD   acetaminophen (TYLENOL) tablet 650 mg, 650 mg, Oral, Q6H PRN **OR** acetaminophen (TYLENOL) suppository 650 mg, 650 mg, Rectal, Q6H PRN, Zhang, Ping T, MD   [START ON 04/07/2021] cefTRIAXone (ROCEPHIN) 2 g in sodium chloride 0.9 % 100 mL IVPB, 2 g, Intravenous, Q12H, Gray, Samuel A, DO   chlorhexidine gluconate (MEDLINE KIT) (PERIDEX) 0.12 % solution 15 mL, 15 mL, Mouth Rinse, BID, Zhang, Ping T, MD   [START ON 04/07/2021] enoxaparin (LOVENOX) injection 40 mg, 40 mg, Subcutaneous, Q24H, Zhang, Ping T, MD   folic acid (FOLVITE) tablet 1 mg, 1 mg, Oral, Daily, Zhang, Ping T, MD   LORazepam (ATIVAN) tablet 1-4 mg, 1-4 mg, Oral, Q1H PRN **OR** LORazepam (ATIVAN) injection 1-4 mg, 1-4 mg, Intravenous, Q1H PRN, Zhang, Ping T, MD   MEDLINE mouth rinse, 15 mL, Mouth Rinse, 10 times per day, Zhang, Ping T, MD   multivitamin with minerals tablet 1 tablet, 1 tablet, Oral, Daily, Zhang, Ping T, MD   ondansetron (ZOFRAN) tablet 4 mg, 4 mg, Oral, Q6H PRN **OR** ondansetron (ZOFRAN) injection 4 mg, 4 mg, Intravenous, Q6H PRN, Zhang, Ping T, MD   thiamine tablet 100 mg, 100 mg, Oral, Daily **OR** thiamine (B-1) injection 100 mg, 100 mg, Intravenous, Daily, Zhang, Ping T, MD     vancomycin (VANCOREADY) IVPB 1500 mg/300 mL, 1,500 mg, Intravenous, Once, Gray, Samuel A, DO, Last Rate: 150 mL/hr at 04/06/21 1701, 1,500 mg at 04/06/21 1701 No current outpatient medications on file.  Exam: Current vital signs: BP (!) 89/45   Pulse 78   Temp (!) 101.6 F (38.7 C) (Oral)   Resp 20   Ht 6' 1" (1.854 m)   Wt 65.3 kg   SpO2 99%   BMI 19.00 kg/m  Vital signs in last 24  hours: Temp:  [100.3 F (37.9 C)-102.4 F (39.1 C)] 101.6 F (38.7 C) (11/17 1802) Pulse Rate:  [78-124] 78 (11/17 1820) Resp:  [17-33] 20 (11/17 1820) BP: (84-155)/(34-130) 89/45 (11/17 1820) SpO2:  [94 %-100 %] 99 % (11/17 1820) Weight:  [65.3 kg] 65.3 kg (11/17 1515)  GENERAL: Ill-appearing male laying in ER stretcher.  Patient is intermittently restless Psych: Patient is cooperative with examination but does have clear confusion Head: Normocephalic and atraumatic, without obvious abnormality EENT: red tinged sclera, dry mucous membranes, no OP obstruction LUNGS: Normal respiratory effort. Non-labored breathing on room air CV: Regular rate and rhythm on telemetry, hypotensive on cardiac monitor ABDOMEN: Soft, non-tender, non-distended Extremities: warm, well perfused, without obvious deformity  NEURO:  Mental Status: Lethargic and frequently falls asleep during conversation.  Patient is oriented to self, time, place.  He is able to state some events leading to hospitalization but does not provide full details. Speech is intact without dysarthria.  Patient follows commands with assistance of Spanish interpreter There is no evidence of aphasia or neglect, though his responses are brief Cranial Nerves:  II: PERRL.  III, IV, VI: EOMI. patient fixates and tracks examiner around room. V: Sensation is intact to light touch and symmetrical to face.  VII: Face is symmetric resting and with movement. VIII: Hearing is intact to voice XII: Tongue protrudes midline without fasciculations.   Motor: 5/5 strength is all muscle groups.  Tone is normal. Bulk is normal.  Sensation: Intact to light touch bilaterally in all four extremities.  DTRs: 2+ and symmetric throughout.  Gait: Deferred for patient safety  Labs I have reviewed labs in epic and the results pertinent to this consultation are: CBC    Component Value Date/Time   WBC 11.6 (H) 04/06/2021 1240   RBC 5.22 04/06/2021 1240    HGB 16.0 04/06/2021 1317   HCT 47.0 04/06/2021 1317   PLT 376 04/06/2021 1240   MCV 86.2 04/06/2021 1240   MCH 28.9 04/06/2021 1240   MCHC 33.6 04/06/2021 1240   RDW 12.6 04/06/2021 1240   LYMPHSABS 0.8 04/06/2021 1240   MONOABS 0.7 04/06/2021 1240   EOSABS 0.1 04/06/2021 1240   BASOSABS 0.1 04/06/2021 1240   CMP     Component Value Date/Time   NA 135 04/06/2021 1317   K 3.3 (L) 04/06/2021 1317   CL 97 (L) 04/06/2021 1240   CO2 22 04/06/2021 1240   GLUCOSE 100 (H) 04/06/2021 1240   BUN 16 04/06/2021 1240   CREATININE 1.02 04/06/2021 1240   CALCIUM 9.8 04/06/2021 1240   PROT 7.8 04/06/2021 1240   ALBUMIN 4.4 04/06/2021 1240   AST 29 04/06/2021 1240   ALT 29 04/06/2021 1240   ALKPHOS 91 04/06/2021 1240   BILITOT 0.6 04/06/2021 1240   GFRNONAA >60 04/06/2021 1240   Lipid Panel  No results found for: CHOL, TRIG, HDL, CHOLHDL, VLDL, LDLCALC, LDLDIRECT  Imaging I have reviewed the images obtained:  CT-scan of the brain 11/17: Normal head CT.    Assessment: 20 year old male who presented to the ED for evaluation of syncopal event with complaints of back pain, hip/thigh pain that is since resolved, fever, and confusion with 2 possible seizure-like episodes prior to arrival.  Patient's presentation is concerning for meningitis and he has been placed on empiric antibiotic and acyclovir coverage pending CSF results.  Due to patient's complaints of back pain with radiating leg pain, will obtain MRI lumbar and thoracic spine prior to CSF to evaluate for epidural abscess.   Recommendations: - Patient to be admitted to ICU overnight  - MRI brain wwo contrast - MRI thoracic and lumbar spine wwo contrast - Will need LP with CSF cell count and differential, gram stain, glucose, protein, and cultures. - Agree with empiric meningitis coverage with antibiotics and acyclovir - Neurology will continue to follow - Plan discussed with Dr. Tacy Learn at bedside   Pt seen by NP/Neuro and later by  MD. Note/plan to be edited by MD as needed.  Anibal Henderson, AGAC-NP [facility] Pager: [phone]  I have seen the patient and reviewed the above note.  I was present for the evaluation and management reflected in the above note. He has a very confusing story, with a broad decline in mental status.  With his description of his abrupt back pain radiating into his lower extremities bilaterally, with some possibility of numbness (this was reported at various times, but not unclear based on my discussion with him) I would favor back imaging prior to attempting a lumbar puncture.  He is being admitted to the ICU, and we will try to use Precedex to get him calm enough to obtain a MRI.  He will need an EEG as well, though by history it is unclear to me if he ever truly had a seizure or just agitation.   Roland Rack, MD [facility] [phone]  If 7pm- 7am, please page neurology on call as listed in [place]. 04/06/2021  7:56 PM

## 2021-04-06 NOTE — ED Notes (Signed)
Patient transported to MRI 

## 2021-04-06 NOTE — H&P (Signed)
NAME:  Eric Hudson, MRN:  [id number], DOB:  [birth year], LOS: 0 ADMISSION DATE:  04/06/2021, CONSULTATION DATE:  04/06/21 REFERRING MD:  Chipper Herb - [facility], CHIEF COMPLAINT:  Shock, AMS    History of Present Illness:  20 year old male with no significant past medical history presented to [facility] ED 11/17 via EMS with complaints of altered mental status, headache, bilateral leg pain, and seizure like activity. 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 Upon arrival to the ED the patient was noted to be febrile to 102.4, tachycardic with HR in the 120s, and hypotensive. He was treated with with antibiotics targeted at meningitis coverage. Lumbar puncture was attempted, but was unsuccessful. Patient denied use of illicit substances. The patient received 4 liters of crystalloid and remained hypotensive prompting the initiation of norepinephrine. PCCM was asked to admit.   Pertinent  Medical History   has no past medical history on file.   Significant Hospital Events: Including procedures, antibiotic start and stop dates in addition to other pertinent events    11/17 admit to ICU on pressors r/o meningitis vs epidural abscess.   Interim History / Subjective:    Objective   Blood pressure (!) 89/36, pulse 79, temperature (!) 101.6 F (38.7 C), temperature source Oral, resp. rate 19, height 6\' 1"  (1.854 m), weight 65.3 kg, SpO2 100 %.       No intake or output data in the 24 hours ending 04/06/21 1855 Filed Weights   04/06/21 1515  Weight: 65.3 kg    Examination: General: young adult male in NAD HENT: [place]/AT, PERRL, no JVD. No nuchal rigidity.  Lungs: Clear bilateral breath sounds Cardiovascular: RRR, no MRG Abdomen: Soft, non-tender, non-distended Extremities: No acute deformity or ROM limitation. Negative Kernig and  Brudzinski. Neuro: Somnolent. Will arouse to answer questions  Resolved Hospital Problem list     Assessment & Plan:   Altered mental status Seizure like activity Back and bilateral leg pain - ddx includes meningitis, epidural abscess, influenza encephalitis  Plan: - Admit to ICU - Neurology following, recommending MRI brain, spine - Lumbar puncture pending (IR), unsuccessful in ED - Ceftriaxone, Vancomycin, acyclovir - Precedex infusion for RASS goal 0 to -1 to assist with MRI  Shock: septic vs hypovolemic - Antibiotics as above - Generous IVF resuscitation - Norepinephrine to keep MAP goal > 04/08/21 - Echocardiogram  Influenza A positive  - Tamiflu - Droplet isolation  Electrolyte abnormalities: hyponatremia, Hypokalemia, hypochloremia, hypomagnesemia, hypocalcemia - repeat chemistry after repletion    Best Practice (right click and "Reselect all SmartList Selections" daily)   Diet/type: NPO w/ oral meds DVT prophylaxis: LMWH GI prophylaxis: PPI Lines: N/A Foley:  N/A Code Status:  full code Last date of multidisciplinary goals of care discussion [ ]   Labs   CBC: Recent Labs  Lab 04/06/21 1240 04/06/21 1317  WBC 11.6*  --   NEUTROABS 9.9*  --   HGB 15.1 16.0  HCT 45.0 47.0  MCV 86.2  --   PLT 376  --     Basic Metabolic Panel: Recent Labs  Lab 04/06/21 1240 04/06/21 1317  NA 131* 135  K 3.4* 3.3*  CL 97*  --   CO2 22  --   GLUCOSE 100*  --   BUN 16  --   CREATININE 1.02  --  CALCIUM 9.8  --   MG 1.5*  --    GFR: Estimated Creatinine Clearance: 107.6 mL/min (by C-G formula based on SCr of 1.02 mg/dL). Recent Labs  Lab 04/06/21 1240  WBC 11.6*    Liver Function Tests: Recent Labs  Lab 04/06/21 1240  AST 29  ALT 29  ALKPHOS 91  BILITOT 0.6  PROT 7.8  ALBUMIN 4.4   Recent Labs  Lab 04/06/21 1240  LIPASE 27   No results for input(s): AMMONIA in the last 168 hours.  ABG    Component Value Date/Time   HCO3 24.4  04/06/2021 1317   TCO2 25 04/06/2021 1317   O2SAT 74.0 04/06/2021 1317     Coagulation Profile: No results for input(s): INR, PROTIME in the last 168 hours.  Cardiac Enzymes: Recent Labs  Lab 04/06/21 1240  CKTOTAL 306    HbA1C: No results found for: HGBA1C  CBG: Recent Labs  Lab 04/06/21 1312  GLUCAP 85    Review of Systems:   Limited due to encephalopathy  + Back pain + L facial numbness    Past Medical History:  He,  has no past medical history on file.   Surgical History:  History reviewed. No pertinent surgical history.   Social History:      Family History:  His family history is not on file.   Allergies Not on File   Home Medications  Prior to Admission medications   Not on File     Critical care time: 39 minutes     Joneen Roach, AGACNP-BC  Pulmonary & Critical Care  See Amion for personal pager PCCM on call pager [phone] until 7pm. Please call Elink 7p-7a. [phone]  04/06/2021 10:48 PM

## 2021-04-07 ENCOUNTER — Other Ambulatory Visit: Payer: Self-pay

## 2021-04-07 ENCOUNTER — Inpatient Hospital Stay (HOSPITAL_COMMUNITY): Payer: Self-pay

## 2021-04-07 DIAGNOSIS — G9341 Metabolic encephalopathy: Secondary | ICD-10-CM | POA: Diagnosis present

## 2021-04-07 LAB — BASIC METABOLIC PANEL
Anion gap: 8 (ref 5–15)
Anion gap: 6 (ref 5–15)
BUN: 10 mg/dL (ref 6–20)
BUN: 11 mg/dL (ref 6–20)
CO2: 23 mmol/L (ref 22–32)
CO2: 23 mmol/L (ref 22–32)
Calcium: 8.6 mg/dL — ABNORMAL LOW (ref 8.9–10.3)
Calcium: 8.4 mg/dL — ABNORMAL LOW (ref 8.9–10.3)
Chloride: 103 mmol/L (ref 98–111)
Chloride: 102 mmol/L (ref 98–111)
Creatinine, Ser: 1.17 mg/dL (ref 0.61–1.24)
Creatinine, Ser: 1.19 mg/dL (ref 0.61–1.24)
GFR, Estimated: >60 mL/min (ref >60)
GFR, Estimated: >60 mL/min (ref >60)
Glucose, Bld: 115 mg/dL — ABNORMAL HIGH (ref 70–99)
Glucose, Bld: 114 mg/dL — ABNORMAL HIGH (ref 70–99)
Potassium: 4.2 mmol/L (ref 3.5–5.1)
Potassium: 3.9 mmol/L (ref 3.5–5.1)
Sodium: 134 mmol/L — ABNORMAL LOW (ref 135–145)
Sodium: 131 mmol/L — ABNORMAL LOW (ref 135–145)

## 2021-04-07 LAB — CSF CELL COUNT WITH DIFFERENTIAL
RBC Count, CSF: 0 /mm3
Tube #: 3
WBC, CSF: 1 /mm3 (ref 0–5)

## 2021-04-07 LAB — VOLATILES,BLD-ACETONE,ETHANOL,ISOPROP,METHANOL
Acetone, blood: <0.01 g/dL (ref 0.000–0.010)
Ethanol, blood: <0.01 g/dL (ref 0.000–0.010)
Isopropanol, blood: <0.01 g/dL (ref 0.000–0.010)
Methanol, blood: <0.01 g/dL (ref 0.000–0.010)

## 2021-04-07 LAB — CBC
HCT: 42.1 % (ref 39.0–52.0)
Hemoglobin: 13.5 g/dL (ref 13.0–17.0)
MCH: 28.4 pg (ref 26.0–34.0)
MCHC: 32.1 g/dL (ref 30.0–36.0)
MCV: 88.4 fL (ref 80.0–100.0)
Platelets: 304 10*3/uL (ref 150–400)
RBC: 4.76 MIL/uL (ref 4.22–5.81)
RDW: 12.9 % (ref 11.5–15.5)
WBC: 8.9 10*3/uL (ref 4.0–10.5)
nRBC: 0 % (ref 0.0–0.2)

## 2021-04-07 LAB — CORTISOL: Cortisol, Plasma: 24.4 ug/dL

## 2021-04-07 LAB — PROTEIN AND GLUCOSE, CSF
Glucose, CSF: 61 mg/dL (ref 40–70)
Total  Protein, CSF: 16 mg/dL (ref 15–45)

## 2021-04-07 LAB — TSH: TSH: 1.097 u[IU]/mL (ref 0.350–4.500)

## 2021-04-07 LAB — MAGNESIUM: Magnesium: 2.3 mg/dL (ref 1.7–2.4)

## 2021-04-07 MED ORDER — SODIUM CHLORIDE 0.9 % IV SOLN: 250.0000 mL | INTRAVENOUS | Status: DC

## 2021-04-07 MED ORDER — IBUPROFEN 200 MG PO TABS
600.0000 mg | ORAL_TABLET | Freq: Once | ORAL | Status: AC
  Administered 2021-04-07: 600 mg via ORAL
  Filled 2021-04-07: qty 3

## 2021-04-07 MED ORDER — NOREPINEPHRINE 4 MG/250ML-% IV SOLN
2.0000 ug/min | INTRAVENOUS | Status: DC
Start: 2021-04-07 — End: 2021-04-07
  Filled 2021-04-07: qty 250

## 2021-04-07 NOTE — Progress Notes (Signed)
Neurology Progress Note  Brief HPI: 20 y.o. male with no significant PMHx who presented to the ED 11/17 for evaluation of lower back pain, bilateral thigh pain, and two episodes of questionable seizure activity while at work and subsequent altered mental status. ED findings revealed patient with a temperature of 102.4 F, tachycardia, hypotension despite fluid resuscitation, and patient that is positive for influenza A.   Subjective: MRI brain, thoracic, and lumbar spine complete without abnormality. Patient to undergo fluoro-guided LP today  Exam: Vitals:   04/07/21 0615 04/07/21 0734  BP: (!) 98/48 (!) 92/48  Pulse: 65 (!) 56  Resp: 20 17  Temp:  98.6 F (37 C)  SpO2: 96% 97%   GENERAL: Laying comfortably in ER stretcher, in no acute distress Head: Normocephalic and atraumatic, without obvious abnormality LUNGS: Normal respiratory effort. Non-labored breathing on room air CV: Bradycardia with regular rhythm on cardiac monitor   NEURO:  Mental Status: Drowsy, prefers to keep eyes closed during conversation, often takes repeat prompting for commands.  Patient is oriented to self, time, place.   He is unable to provide details regarding events leading to hospitalization.  Speech is intact without dysarthria.  Patient follows commands with assistance of Spanish interpreter There is no evidence of aphasia or neglect, though his responses are brief Cranial Nerves:  II: PERRL.  III, IV, VI: EOMI. Patient fixates and tracks examiner around room. V: Sensation is intact to light touch and symmetrical to face.  VII: Face is symmetric resting and with movement. VIII: Hearing is intact to voice XII: Tongue protrudes midline without fasciculations.   Motor: 5/5 strength is all muscle groups.  Tone is normal. Bulk is normal.  Sensation: Intact to light touch bilaterally in all four extremities.  Gait: Deferred for patient safety   Pertinent Labs: CBC    Component Value Date/Time   WBC  8.9 04/07/2021 0245   RBC 4.76 04/07/2021 0245   HGB 13.5 04/07/2021 0245   HCT 42.1 04/07/2021 0245   PLT 304 04/07/2021 0245   MCV 88.4 04/07/2021 0245   MCH 28.4 04/07/2021 0245   MCHC 32.1 04/07/2021 0245   RDW 12.9 04/07/2021 0245   LYMPHSABS 0.8 04/06/2021 1240   MONOABS 0.7 04/06/2021 1240   EOSABS 0.1 04/06/2021 1240   BASOSABS 0.1 04/06/2021 1240  CMP     Component Value Date/Time   NA 131 (L) 04/07/2021 0245   K 3.9 04/07/2021 0245   CL 102 04/07/2021 0245   CO2 23 04/07/2021 0245   GLUCOSE 114 (H) 04/07/2021 0245   BUN 11 04/07/2021 0245   CREATININE 1.19 04/07/2021 0245   CALCIUM 8.4 (L) 04/07/2021 0245   PROT 7.8 04/06/2021 1240   ALBUMIN 4.4 04/06/2021 1240   AST 29 04/06/2021 1240   ALT 29 04/06/2021 1240   ALKPHOS 91 04/06/2021 1240   BILITOT 0.6 04/06/2021 1240   GFRNONAA >60 04/07/2021 0245  Drugs of Abuse     Component Value Date/Time   LABOPIA POSITIVE (A) 04/06/2021 1614   COCAINSCRNUR NONE DETECTED 04/06/2021 1614   LABBENZ POSITIVE (A) 04/06/2021 1614   AMPHETMU NONE DETECTED 04/06/2021 1614   THCU NONE DETECTED 04/06/2021 1614   LABBARB NONE DETECTED 04/06/2021 1614     Latest Reference Range & Units 04/06/21 16:14  Amphetamines NONE DETECTED  NONE DETECTED  Barbiturates NONE DETECTED  NONE DETECTED  Benzodiazepines NONE DETECTED  POSITIVE !  Opiates NONE DETECTED  POSITIVE !  COCAINE NONE DETECTED  NONE DETECTED  Tetrahydrocannabinol NONE  DETECTED  NONE DETECTED    Latest Reference Range & Units 04/06/21 16:14  RESP PANEL BY RT-PCR (FLU A&B, COVID) ARPGX2  Rpt !  Influenza A By PCR NEGATIVE  POSITIVE !  Influenza B By PCR NEGATIVE  NEGATIVE  SARS Coronavirus 2 by RT PCR NEGATIVE  NEGATIVE   Imaging Reviewed:  MRI brain 11/17: Normal brain MRI  MRI thoracic and lumbar spine 11/17: Normal MRI of the thoracic and lumbar spine.  Assessment: 20 year old male who presented to the ED for evaluation of syncopal event with complaints of  back pain, hip/thigh pain that is since resolved, fever, and confusion with two possible seizure-like episodes prior to arrival, unclear if patient truly had a seizure versus agitation. Patient's presentation is concerning for meningitis and he has been placed on empiric antibiotic and acyclovir coverage pending CSF results. Due to patient's complaints of back pain with radiating leg pain MRI thoracic and lumbar spine was obtained to rule out abscess prior to LP. Patient was also found to have a positive influenza A result which may also be the etiology of his presentation.   Recommendations: - LP pending with CSF studies: cell count and differential, gram stain, glucose, protein, and cultures. - Continue empiric antibiotics and acyclovir pending CSF studies, will deescalate as appropriate  - Follow up on routine EEG   Lanae Boast, AGACNP-BC [facility] [phone]  I have seen the patient and reviewed the above note.  LP and EEG are normal.  At this point, he has returned to his normal self.  I am unclear what caused him to become confused yesterday,?  Mild delirium in the setting of fever.  Based on history I do not have clear evidence of seizures, and therefore would not start antiepileptic medication.  Now with CNS infection ruled out, no further investigations needed from a neurology standpoint.  I would continue supportive care for influenza, neurology will be available on an as-needed basis, please call with further questions or concerns.  Ritta Slot, MD [facility] [phone]  If 7pm- 7am, please page neurology on call as listed in AMION.

## 2021-04-07 NOTE — Procedures (Signed)
Patient Name: Eric Hudson  MRN: [id number]  Epilepsy Attending: Charlsie Quest  Referring Physician/Provider: Dr. Onalee Hua Date: 04/07/2021 Duration: 21.26 mins  Patient history: 20 year old male with an episode of syncopal event with possible seizure-like episodes.  EEG to evaluate for seizure.  Level of alertness: Awake  AEDs during EEG study: None  Technical aspects: This EEG study was done with scalp electrodes positioned according to the 10-20 International system of electrode placement. Electrical activity was acquired at a sampling rate of 500Hz  and reviewed with a high frequency filter of 70Hz  and a low frequency filter of 1Hz . EEG data were recorded continuously and digitally stored.   Description: The posterior dominant rhythm consists of 9-10 Hz activity of moderate voltage (25-35 uV) seen predominantly in posterior head regions, symmetric and reactive to eye opening and eye closing. Hyperventilation and photic stimulation were not performed.     IMPRESSION: This study is within normal limits. No seizures or epileptiform discharges were seen throughout the recording.  [name] 

## 2021-04-07 NOTE — Plan of Care (Signed)

## 2021-04-07 NOTE — Progress Notes (Addendum)
   NAME:  Eric Hudson, MRN:  [id number], DOB:  2001-03-12, LOS: 1 ADMISSION DATE:  04/06/2021, CONSULTATION DATE:  04/06/21 REFERRING MD:  Chipper Herb - [facility], CHIEF COMPLAINT:  Shock, AMS    History of Present Illness:  20 year old male with no significant past medical history presented to [facility] ED 11/17 via EMS with complaints of altered mental status, headache, bilateral leg pain, and seizure like activity. 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 Upon arrival to the ED the patient was noted to be febrile to 102.4, tachycardic with HR in the 120s, and hypotensive. He was treated with with antibiotics targeted at meningitis coverage. Lumbar puncture was attempted, but was unsuccessful. Patient denied use of illicit substances. The patient received 4 liters of crystalloid and remained hypotensive prompting the initiation of norepinephrine. PCCM was asked to admit.   Pertinent  Medical History   has no past medical history on file.   Significant Hospital Events: Including procedures, antibiotic start and stop dates in addition to other pertinent events    11/17 admit to ICU on pressors r/o meningitis vs epidural abscess.   Interim History / Subjective:    Objective   Blood pressure (!) 92/48, pulse (!) 56, temperature 98.6 F (37 C), temperature source Oral, resp. rate 17, height 6\' 1"  (1.854 m), weight 65.3 kg, SpO2 97 %.        Intake/Output Summary (Last 24 hours) at 04/07/2021 0741 Last data filed at 04/07/2021 0735 Gross per 24 hour  Intake 730.97 ml  Output 1775 ml  Net -1044.03 ml   Filed Weights   04/06/21 1515  Weight: 65.3 kg    Examination: No distress resting comfortably Moving purposefully, was following commands per family at bedside Interpreter line not working unfortunately  Pan-MRI neg Mild  hyponatremia  Resolved Hospital Problem list     Assessment & Plan:   Acute metabolic encephalopathy, ? Seizure like activity- influenza associated cerebral dysfunction seems most likely.  MRI head/C/T/L spine benign.   Back and bilateral leg pain Influenza positive Shock resolved Mild hyponatremia  Overall improved; was ICU status just for precedex for imaging studies but these have been completed.  Stable for tele.  Will try to get LP today.  Low threshold to DC meningitis coverage.  Continue tamiflu  Check EEG, appreciate neuro inpuit  Stable for med/tele, appreciate [facility] taking over starting 04/08/21

## 2021-04-07 NOTE — Progress Notes (Signed)
EEG complete - results pending 

## 2021-04-07 NOTE — Progress Notes (Signed)
CSF reviewed: WNL EEG WNL DC abx and acyclovir Continue tamiflu.  Myrla Halsted MD PCCM

## 2021-04-07 NOTE — Progress Notes (Signed)
eLink Physician-Brief Progress Note Patient Name: Eric Hudson DOB: 2000-10-04 MRN: [id number]   Date of Service  04/07/2021  HPI/Events of Note  Notified by Pharmacy regarding 1/2 blood cultures growing gram positive rods.  Likely contaminant.  Patient admitted with AMS, poss seizures, ruled out meningitis, now back to baseline per Neuro.    eICU Interventions  Antibiotics discontinued today. Patient transferred to floor  Repeat blood cultures in AM.          [name] 04/07/2021, 11:22 PM

## 2021-04-07 NOTE — Progress Notes (Signed)
PHARMACY - PHYSICIAN COMMUNICATION CRITICAL VALUE ALERT - BLOOD CULTURE IDENTIFICATION (BCID)  Eric Hudson is an 20 y.o. male who presented to [facility] on 04/06/2021 with a chief complaint of AMS/possible seizure  Assessment:   1/2 blood cultures growing gram positive rods, likely contaminant  Name of physician (or Provider) Contacted:  Dr. Everardo All  Current antibiotics:  None  Changes to prescribed antibiotics recommended:  No change at this time  No results found for this or any previous visit.  Eddie Candle 04/07/2021  11:03 PM

## 2021-04-07 NOTE — Procedures (Signed)
LUMBAR PUNCTURE (SPINAL TAP) PROCEDURE NOTE  Indication: Altered mental status with fever, possible seizures   Proceduralists: Lanae Boast, NP and Dr. Amada Jupiter   Risks of the procedure were dicussed with the patient including post-LP headache, bleeding, infection, weakness/numbness of legs(radiculopathy), death.    Consent obtained from: patient with use of video interpreter    Procedure Note The patient was prepped and draped, and using sterile technique a 20 gauge quinke spinal needle was inserted in the L4-5 space.   Opening pressure was 18 cm H2O.  Approximately 14 cc of CSF were obtained and sent for analysis.  Patient tolerated the procedure well and blood loss was minimal.    Lanae Boast, AGAC-NP [facility] Pager: [phone]  I was present for the entire procedure.  Ritta Slot, MD [facility] [phone]  If 7pm- 7am, please page neurology on call as listed in AMION.

## 2021-04-08 DIAGNOSIS — R569 Unspecified convulsions: Secondary | ICD-10-CM

## 2021-04-08 LAB — BASIC METABOLIC PANEL
Anion gap: 8 (ref 5–15)
BUN: 13 mg/dL (ref 6–20)
CO2: 24 mmol/L (ref 22–32)
Calcium: 9 mg/dL (ref 8.9–10.3)
Chloride: 105 mmol/L (ref 98–111)
Creatinine, Ser: 1.1 mg/dL (ref 0.61–1.24)
GFR, Estimated: >60 mL/min (ref >60)
Glucose, Bld: 99 mg/dL (ref 70–99)
Potassium: 4 mmol/L (ref 3.5–5.1)
Sodium: 137 mmol/L (ref 135–145)

## 2021-04-08 MED ORDER — ENOXAPARIN SODIUM 40 MG/0.4ML IJ SOSY
40.0000 mg | PREFILLED_SYRINGE | INTRAMUSCULAR | Status: DC
  Administered 2021-04-08 – 2021-04-09 (×2): 40 mg via SUBCUTANEOUS
  Filled 2021-04-08 (×2): qty 0.4

## 2021-04-08 NOTE — Plan of Care (Signed)

## 2021-04-08 NOTE — Progress Notes (Addendum)
PROGRESS NOTE        PATIENT DETAILS Name: Eric Hudson Age: 20 y.o. Sex: male Date of Birth: 2000-06-01 Admit Date: 04/06/2021 Admitting Physician Candee Furbish, MD XTG:GYIRSWN, Provider, MD  Brief Narrative: Patient is a 20 y.o. male with no significant history-presented to the ED with altered mental status, headache, fever, bilateral leg pain, seizure-like activity and hypotension-felt to have septic shock and acute metabolic encephalopathy.  Patient was briefly admitted to the ICU-Levophed was quickly weaned off-however he did require Precedex infusion in order to undergo LP/MRI studies.  He was eventually diagnosed with influenza A.  Upon stability-he was transferred to the [facility] on 11/19.  See below for further details.  Subjective: Lying comfortably in bed-denies any chest pain or shortness of breath.  Feels much better today.  Headache is resolving.  Objective: Vitals: Blood pressure 119/67, pulse (!) 54, temperature 98.1 F (36.7 C), temperature source Oral, resp. rate 20, height _0  (1.854 m), weight 65.3 kg, SpO2 97 %.   Exam: Gen Exam:Alert awake-not in any distress HEENT:atraumatic, normocephalic Chest: B/L clear to auscultation anteriorly CVS:S1S2 regular Abdomen:soft non tender, non distended Extremities:no edema Neurology: Non focal Skin: no rash  Pertinent Labs/Radiology: Recent Labs  Lab 04/06/21 1240 04/06/21 1317 04/07/21 0245 04/08/21 0102  WBC 11.6*  --  8.9  --   HGB 15.1   < > 13.5  --   PLT 376  --  304  --   NA 131*   < > 131* 137  K 3.4*   < > 3.9 4.0  CREATININE 1.02   < > 1.19 1.10  AST 29  --   --   --   ALT 29  --   --   --   ALKPHOS 91  --   --   --   BILITOT 0.6  --   --   --    < > = values in this interval not displayed.   11/17>> COVID PCR: Negative 11/17>> influenza A: Positive  11/17>> blood culture: 1/2 gram-positive rod (likely a contaminant) 11/18>> CSF culture: No growth so  far 11/19>> blood culture: Pending  11/17>> CT chest/abdomen/pelvis: No acute finding in the chest/abdomen and pelvis. 11/17>> MRI brain: No acute intracranial abnormality 11/17>> MRI T-spine: No acute cord abnormality 11/17>> MRI L-spine: No acute cord abnormality  11/18>> EEG: No seizures  Assessment/Plan: Septic shock: Due to influenza-has resolved-BP now stable.  Initially covered with broad-spectrum antimicrobial therapy-which is all been discontinued but remains on Tamiflu  Acute metabolic encephalopathy/seizure-like activity: Due to septic shock/influenza-encephalopathy has resolved-patient is completely awake and alert this morning.  MRI brain/EEG negative for significant abnormalities.  CSF cultures in progress-but negative so far.  No indication for antiepileptic therapy at this point  Gram-positive rods bacteremia: Likely contaminant-follow repeat cultures ordered on 11/19.  Influenza A: Likely the etiology of his presenting symptoms-on Tamiflu.  Saline lock all IV fluids-ambulate-if he continues to clinically improve-on all cultures are still negative-suspect he could be discharged in the next 1-2 days.  BMI Estimated body mass index is 18.99 kg/m as calculated from the following:   Height as of this encounter: _1  (1.854 m).   Weight as of this encounter: 65.3 kg.     Procedures: 11/18>> LP by neurology Consults: PCCM/neurology DVT Prophylaxis: Lovenox Code Status:Full code or DNR Family Communication:  Sister at bedside.  Time spent: 25- minutes-Greater than 50% of this time was spent in counseling, explanation of diagnosis, planning of further management, and coordination of care.   Disposition Plan: Status is: Inpatient  Remains inpatient appropriate because: Resolving sepsis-encephalopathy-influenza A.   Diet: Diet Order             Diet regular Room service appropriate? Yes; Fluid consistency: Thin  Diet effective now                      Antimicrobial agents: Anti-infectives (From admission, onward)    Start     Dose/Rate Route Frequency Ordered Stop   04/07/21 0500  cefTRIAXone (ROCEPHIN) 2 g in sodium chloride 0.9 % 100 mL IVPB  Status:  Discontinued        2 g 200 mL/hr over 30 Minutes Intravenous Every 12 hours 04/06/21 1632 04/07/21 1545   04/07/21 0100  vancomycin (VANCOREADY) IVPB 1000 mg/200 mL  Status:  Discontinued        1,000 mg 200 mL/hr over 60 Minutes Intravenous Every 8 hours 04/06/21 1927 04/07/21 1545   04/06/21 2200  oseltamivir (TAMIFLU) capsule 75 mg        75 mg Oral 2 times daily 04/06/21 1924 04/11/21 2159   04/06/21 1930  acyclovir (ZOVIRAX) 655 mg in dextrose 5 % 100 mL IVPB  Status:  Discontinued        10 mg/kg  65.3 kg 113.1 mL/hr over 60 Minutes Intravenous Every 8 hours 04/06/21 1842 04/07/21 1545   04/06/21 1645  cefTRIAXone (ROCEPHIN) 1 g in sodium chloride 0.9 % 100 mL IVPB        1 g 200 mL/hr over 30 Minutes Intravenous  Once 04/06/21 1632 04/06/21 1743   04/06/21 1530  vancomycin (VANCOREADY) IVPB 1500 mg/300 mL        1,500 mg 150 mL/hr over 120 Minutes Intravenous Once 04/06/21 1515 04/06/21 1920   04/06/21 1530  cefTRIAXone (ROCEPHIN) 1 g in sodium chloride 0.9 % 100 mL IVPB        1 g 200 mL/hr over 30 Minutes Intravenous  Once 04/06/21 1515 04/06/21 1634        MEDICATIONS: Scheduled Meds:  chlorhexidine gluconate (MEDLINE KIT)  15 mL Mouth Rinse BID   folic acid  1 mg Oral Daily   mouth rinse  15 mL Mouth Rinse 10 times per day   multivitamin with minerals  1 tablet Oral Daily   oseltamivir  75 mg Oral BID   thiamine  100 mg Oral Daily   Or   thiamine  100 mg Intravenous Daily   Continuous Infusions:  sodium chloride 125 mL/hr at 04/08/21 0200   sodium chloride     PRN Meds:.acetaminophen **OR** acetaminophen, ondansetron **OR** ondansetron (ZOFRAN) IV   I have personally reviewed following labs and imaging studies  LABORATORY DATA: CBC: Recent Labs   Lab 04/06/21 1240 04/06/21 1317 04/07/21 0245  WBC 11.6*  --  8.9  NEUTROABS 9.9*  --   --   HGB 15.1 16.0 13.5  HCT 45.0 47.0 42.1  MCV 86.2  --  88.4  PLT 376  --  629    Basic Metabolic Panel: Recent Labs  Lab 04/06/21 1240 04/06/21 1317 04/07/21 0055 04/07/21 0245 04/08/21 0102  NA 131* 135 134* 131* 137  K 3.4* 3.3* 4.2 3.9 4.0  CL 97*  --  103 102 105  CO2 22  --  23 23 24  GLUCOSE 100*  --  115* 114* 99  BUN 16  --  _0 CREATININE 1.02  --  1.17 1.19 1.10  CALCIUM 9.8  --  8.6* 8.4* 9.0  MG 1.5*  --  2.3  --   --     GFR: Estimated Creatinine Clearance: 99.8 mL/min (by C-G formula based on SCr of 1.1 mg/dL).  Liver Function Tests: Recent Labs  Lab 04/06/21 1240  AST 29  ALT 29  ALKPHOS 91  BILITOT 0.6  PROT 7.8  ALBUMIN 4.4   Recent Labs  Lab 04/06/21 1240  LIPASE 27   No results for input(s): AMMONIA in the last 168 hours.  Coagulation Profile: No results for input(s): INR, PROTIME in the last 168 hours.  Cardiac Enzymes: Recent Labs  Lab 04/06/21 1240  CKTOTAL 306    BNP (last 3 results) No results for input(s): PROBNP in the last 8760 hours.  Lipid Profile: No results for input(s): CHOL, HDL, LDLCALC, TRIG, CHOLHDL, LDLDIRECT in the last 72 hours.  Thyroid Function Tests: Recent Labs    04/07/21 0055  TSH 1.097    Anemia Panel: No results for input(s): VITAMINB12, FOLATE, FERRITIN, TIBC, IRON, RETICCTPCT in the last 72 hours.  Urine analysis:    Component Value Date/Time   COLORURINE YELLOW 04/06/2021 [place] 04/06/2021 1614   LABSPEC 1.026 04/06/2021 1614   PHURINE 7.0 04/06/2021 1614   GLUCOSEU NEGATIVE 04/06/2021 1614   HGBUR NEGATIVE 04/06/2021 1614   BILIRUBINUR NEGATIVE 04/06/2021 1614   KETONESUR 5 (A) 04/06/2021 1614   PROTEINUR NEGATIVE 04/06/2021 1614   NITRITE NEGATIVE 04/06/2021 1614   LEUKOCYTESUR NEGATIVE 04/06/2021 1614    Sepsis Labs: Lactic Acid, Venous    Component  Value Date/Time   LATICACIDVEN 1.2 04/06/2021 1812    MICROBIOLOGY: Recent Results (from the past 240 hour(s))  Blood culture (routine x 2)     Status: None (Preliminary result)   Collection Time: 04/06/21  3:29 PM   Specimen: BLOOD  Result Value Ref Range Status   Specimen Description BLOOD LEFT ANTECUBITAL  Final   Special Requests   Final    BOTTLES DRAWN AEROBIC AND ANAEROBIC Blood Culture adequate volume   Culture  Setup Time   Final    GRAM POSITIVE RODS AEROBIC BOTTLE ONLY CRITICAL RESULT CALLED TO, READ BACK BY AND VERIFIED WITH: PHARMD GREG ABBOTT 04/07/21_1 :02 BY TW    Culture   Final    CULTURE REINCUBATED FOR BETTER GROWTH Performed at [facility], [place] [address] [place], [place] [address]    Report Status PENDING  Incomplete  Blood culture (routine x 2)     Status: None (Preliminary result)   Collection Time: 04/06/21  3:30 PM   Specimen: BLOOD RIGHT HAND  Result Value Ref Range Status   Specimen Description BLOOD RIGHT HAND  Final   Special Requests   Final    BOTTLES DRAWN AEROBIC AND ANAEROBIC Blood Culture results may not be optimal due to an inadequate volume of blood received in culture bottles   Culture   Final    NO GROWTH < 24 HOURS Performed at [facility], [place] [address], [place], [place] [address]    Report Status PENDING  Incomplete  Resp Panel by RT-PCR (Flu A&B, Covid) Urine, Clean Catch     Status: Abnormal   Collection Time: 04/06/21  4:14 PM   Specimen: Urine, Clean Catch; Nasopharyngeal(NP) swabs in vial transport medium  Result  Value Ref Range Status   SARS Coronavirus 2 by RT PCR NEGATIVE NEGATIVE Final    Comment: (NOTE) SARS-CoV-2 target nucleic acids are NOT DETECTED.  The SARS-CoV-2 RNA is generally detectable in upper respiratory specimens during the acute phase of infection. The lowest concentration of SARS-CoV-2 viral copies this assay can detect is 138 copies/mL. A negative result does not preclude  SARS-Cov-2 infection and should not be used as the sole basis for treatment or other patient management decisions. A negative result may occur with  improper specimen collection/handling, submission of specimen other than nasopharyngeal swab, presence of viral mutation(s) within the areas targeted by this assay, and inadequate number of viral copies(<138 copies/mL). A negative result must be combined with clinical observations, patient history, and epidemiological information. The expected result is Negative.  Fact Sheet for Patients:  EntrepreneurPulse.com.au  Fact Sheet for Healthcare Providers:  IncredibleEmployment.be  This test is no t yet approved or cleared by the [place] FDA and  has been authorized for detection and/or diagnosis of SARS-CoV-2 by FDA under an Emergency Use Authorization (EUA). This EUA will remain  in effect (meaning this test can be used) for the duration of the COVID-19 declaration under Section 564(b)(1) of the Act, 21 U.S.C.section 360bbb-3(b)(1), unless the authorization is terminated  or revoked sooner.       Influenza A by PCR POSITIVE (A) NEGATIVE Final   Influenza B by PCR NEGATIVE NEGATIVE Final    Comment: (NOTE) The Xpert Xpress SARS-CoV-2/FLU/RSV plus assay is intended as an aid in the diagnosis of influenza from Nasopharyngeal swab specimens and should not be used as a sole basis for treatment. Nasal washings and aspirates are unacceptable for Xpert Xpress SARS-CoV-2/FLU/RSV testing.  Fact Sheet for Patients: EntrepreneurPulse.com.au  Fact Sheet for Healthcare Providers: IncredibleEmployment.be  This test is not yet approved or cleared by the [place] FDA and has been authorized for detection and/or diagnosis of SARS-CoV-2 by FDA under an Emergency Use Authorization (EUA). This EUA will remain in effect (meaning this test can be used) for the duration of  the COVID-19 declaration under Section 564(b)(1) of the Act, 21 U.S.C. section 360bbb-3(b)(1), unless the authorization is terminated or revoked.  Performed at [facility], [place] [address], [place], [place] [address]   CSF culture w Stat Gram Stain     Status: None (Preliminary result)   Collection Time: 04/07/21 11:10 AM   Specimen: CSF; Cerebrospinal Fluid  Result Value Ref Range Status   Specimen Description CSF  Final   Special Requests NONE  Final   Gram Stain   Final    WBC PRESENT, PREDOMINANTLY MONONUCLEAR NO ORGANISMS SEEN CYTOSPIN SMEAR    Culture   Final    NO GROWTH < 24 HOURS Performed at [facility], [place] [address], [place], [place] [address]    Report Status PENDING  Incomplete    RADIOLOGY STUDIES/RESULTS: CT HEAD WO CONTRAST (5MM)  Result Date: 04/06/2021 CLINICAL DATA:  First-time seizure EXAM: CT HEAD WITHOUT CONTRAST TECHNIQUE: Contiguous axial images were obtained from the base of the skull through the vertex without intravenous contrast. COMPARISON:  None FINDINGS: Brain: There is no evidence of acute intracranial hemorrhage, extra-axial fluid collection, or acute infarct. Parenchymal volume is normal. The ventricles are normal in size. There is no mass lesion. There is no midline shift. Vascular: No hyperdense vessel or unexpected calcification. Skull: Normal. Negative for fracture or focal lesion. Sinuses/Orbits: The paranasal sinuses are clear. The globes and orbits  are unremarkable. Other: None. IMPRESSION: Normal head CT. Electronically Signed   By: Valetta Mole M.D.   On: 04/06/2021 14:53   MR BRAIN W WO CONTRAST  Result Date: 04/06/2021 CLINICAL DATA:  Fever and new onset seizure EXAM: MRI HEAD WITHOUT AND WITH CONTRAST TECHNIQUE: Multiplanar, multiecho pulse sequences of the brain and surrounding structures were obtained without and with intravenous contrast. CONTRAST:  945m GADAVIST GADOBUTROL 1 MMOL/ML IV SOLN COMPARISON:  None. FINDINGS:  Brain: No acute infarct, mass effect or extra-axial collection. No acute or chronic hemorrhage. Normal white matter signal, parenchymal volume and CSF spaces. The midline structures are normal. There is no abnormal contrast enhancement. Vascular: Major flow voids are preserved. Skull and upper cervical spine: Normal calvarium and skull base. Visualized upper cervical spine and soft tissues are normal. Sinuses/Orbits:No paranasal sinus fluid levels or advanced mucosal thickening. No mastoid or middle ear effusion. Normal orbits. IMPRESSION: Normal brain MRI. Electronically Signed   By: KUlyses JarredM.D.   On: 04/06/2021 21:09   MR THORACIC SPINE W WO CONTRAST  Result Date: 04/06/2021 CLINICAL DATA:  Fever and new onset seizure EXAM: MRI THORACIC AND LUMBAR SPINE WITHOUT AND WITH CONTRAST TECHNIQUE: Multiplanar and multiecho pulse sequences of the thoracic and lumbar spine were obtained without and with intravenous contrast. CONTRAST:  760mGADAVIST GADOBUTROL 1 MMOL/ML IV SOLN COMPARISON:  None. FINDINGS: MRI THORACIC SPINE FINDINGS Alignment:  Physiologic. Vertebrae: No fracture, evidence of discitis, or bone lesion. Cord:  Normal signal and morphology. Paraspinal and other soft tissues: Negative. Disc levels: No disc herniation, spinal canal stenosis or neural foraminal stenosis. MRI LUMBAR SPINE FINDINGS Segmentation: Transitional lumbosacral anatomy with sacralization of L5 Alignment:  Physiologic. Vertebrae:  No fracture, evidence of discitis, or bone lesion. Conus medullaris: Extends to the L1 level and appears normal. Paraspinal and other soft tissues: Distended urinary bladder Disc levels: No disc herniation, spinal canal stenosis or neural foraminal stenosis. No abnormal contrast enhancement. IMPRESSION: Normal MRI of the thoracic and lumbar spine. Electronically Signed   By: KeUlyses Jarred.D.   On: 04/06/2021 21:29   MR Lumbar Spine W Wo Contrast  Result Date: 04/06/2021 CLINICAL DATA:  Fever and  new onset seizure EXAM: MRI THORACIC AND LUMBAR SPINE WITHOUT AND WITH CONTRAST TECHNIQUE: Multiplanar and multiecho pulse sequences of the thoracic and lumbar spine were obtained without and with intravenous contrast. CONTRAST:  45m23mADAVIST GADOBUTROL 1 MMOL/ML IV SOLN COMPARISON:  None. FINDINGS: MRI THORACIC SPINE FINDINGS Alignment:  Physiologic. Vertebrae: No fracture, evidence of discitis, or bone lesion. Cord:  Normal signal and morphology. Paraspinal and other soft tissues: Negative. Disc levels: No disc herniation, spinal canal stenosis or neural foraminal stenosis. MRI LUMBAR SPINE FINDINGS Segmentation: Transitional lumbosacral anatomy with sacralization of L5 Alignment:  Physiologic. Vertebrae:  No fracture, evidence of discitis, or bone lesion. Conus medullaris: Extends to the L1 level and appears normal. Paraspinal and other soft tissues: Distended urinary bladder Disc levels: No disc herniation, spinal canal stenosis or neural foraminal stenosis. No abnormal contrast enhancement. IMPRESSION: Normal MRI of the thoracic and lumbar spine. Electronically Signed   By: KevUlyses JarredD.   On: 04/06/2021 21:29   EEG adult  Result Date: 04/07/2021 YadLora HavensD     04/07/2021 11:26 AM Patient Name: AgnCrimson BeerN: [id number] Attending: PriLora Havensferring Physician/Provider: Dr. McNKathrynn Speedte: 04/07/2021 Duration: 21.26 mins Patient history: [age]ar old male with an episode of syncopal event with possible seizure-like episodes.  EEG to  evaluate for seizure. Level of alertness: Awake AEDs during EEG study: None Technical aspects: This EEG study was done with scalp electrodes positioned according to the 10-20 International system of electrode placement. Electrical activity was acquired at a sampling rate of _0  and reviewed with a high frequency filter of _1  and a low frequency filter of _2 . EEG data were recorded continuously and digitally stored. Description: The  posterior dominant rhythm consists of 9-10 Hz activity of moderate voltage (25-35 uV) seen predominantly in posterior head regions, symmetric and reactive to eye opening and eye closing. Hyperventilation and photic stimulation were not performed.   IMPRESSION: This study is within normal limits. No seizures or epileptiform discharges were seen throughout the recording. Lora Havens   CT Angio Chest/Abd/Pel for Dissection W and/or Wo Contrast  Result Date: 04/06/2021 CLINICAL DATA:  Chest pain, back pain EXAM: CT ANGIOGRAPHY CHEST, ABDOMEN AND PELVIS TECHNIQUE: Non-contrast CT of the chest was initially obtained. Multidetector CT imaging through the chest, abdomen and pelvis was performed using the standard protocol during bolus administration of intravenous contrast. Multiplanar reconstructed images and MIPs were obtained and reviewed to evaluate the vascular anatomy. CONTRAST:  151m OMNIPAQUE IOHEXOL 350 MG/ML SOLN COMPARISON:  None. FINDINGS: CTA CHEST FINDINGS Cardiovascular: The heart is not enlarged. There is no pericardial effusion. There is no evidence of acute intramural hematoma or aortic dissection. The major branch vessels in the chest are normal. Mediastinum/Nodes: The thyroid is unremarkable. The esophagus is grossly unremarkable. There is no mediastinal, hilar, or axillary lymphadenopathy. Lungs/Pleura: The trachea and central airways are patent. The lungs are clear, with no focal consolidation or pulmonary edema. There is no pleural effusion or pneumothorax. Musculoskeletal: There is no acute osseous abnormality or aggressive osseous lesion. Review of the MIP images confirms the above findings. CTA ABDOMEN AND PELVIS FINDINGS VASCULAR Aorta: Normal caliber aorta without aneurysm, dissection, vasculitis or significant stenosis. Celiac: Patent without evidence of aneurysm, dissection, vasculitis or significant stenosis. SMA: Patent without evidence of aneurysm, dissection, vasculitis or  significant stenosis. Renals: Both renal arteries are patent without evidence of aneurysm, dissection, vasculitis, fibromuscular dysplasia or significant stenosis. IMA: Patent without evidence of aneurysm, dissection, vasculitis or significant stenosis. Inflow: Patent without evidence of aneurysm, dissection, vasculitis or significant stenosis. Veins: No obvious venous abnormality within the limitations of this arterial phase study. Review of the MIP images confirms the above findings. NON-VASCULAR Hepatobiliary: The liver and gallbladder are unremarkable. There is no biliary ductal dilatation. Pancreas: Unremarkable. Spleen: Unremarkable. Adrenals/Urinary Tract: The adrenals are unremarkable. There is a punctate nonobstructing left upper pole renal stone. The kidneys are otherwise unremarkable. There is no hydronephrosis or hydroureter. The bladder is unremarkable. Stomach/Bowel: The stomach is unremarkable. There is no evidence of bowel obstruction. There is no abnormal bowel wall thickening or inflammatory change. The appendix is normal. Lymphatic: There is no abdominal or pelvic lymphadenopathy. Reproductive: The prostate and seminal vesicles are unremarkable. Other: There is no ascites or free air. Musculoskeletal: There is no acute osseous abnormality or aggressive osseous lesion. Review of the MIP images confirms the above findings. IMPRESSION: 1. No acute findings in the chest, abdomen, or pelvis. No evidence of aortic dissection. 2. Punctate nonobstructing left renal stone. Electronically Signed   By: PValetta MoleM.D.   On: 04/06/2021 15:04     LOS: 2 days   SOren Binet MD  [facility]    To contact the attending provider between 7A-7P or the covering provider during after hours 7P-7A, please log  into the web site [email or web address] and access using universal [place] password for that web site. If you do not have the password, please call the hospital operator.  04/08/2021, 10:08  AM

## 2021-04-09 LAB — BASIC METABOLIC PANEL
Anion gap: 7 (ref 5–15)
BUN: 17 mg/dL (ref 6–20)
CO2: 26 mmol/L (ref 22–32)
Calcium: 9.4 mg/dL (ref 8.9–10.3)
Chloride: 103 mmol/L (ref 98–111)
Creatinine, Ser: 0.96 mg/dL (ref 0.61–1.24)
GFR, Estimated: >60 mL/min (ref >60)
Glucose, Bld: 122 mg/dL — ABNORMAL HIGH (ref 70–99)
Potassium: 3.8 mmol/L (ref 3.5–5.1)
Sodium: 136 mmol/L (ref 135–145)

## 2021-04-09 MED ORDER — OSELTAMIVIR PHOSPHATE 75 MG PO CAPS: 75.0000 mg | ORAL_CAPSULE | Freq: Two times a day (BID) | ORAL | 0 refills | Status: AC

## 2021-04-09 NOTE — Discharge Summary (Signed)
Eric Hudson XLK:[id number] DOB: [birth year] DOA: 04/06/2021  PCP: Default, Provider, MD  Admit date: 04/06/2021  Discharge date: 04/09/2021  Admitted From: Home   Disposition:  Home   Recommendations for Outpatient Follow-up:   Follow up with PCP in 1-2 weeks  PCP Please obtain BMP/CBC, 2 view CXR in 1week,  (see Discharge instructions)   PCP Please follow up on the following pending results: Please check final blood culture results which were drawn on 04/08/2021.   Home Health: None   Equipment/Devices: None  Consultations: PCCM, Neuro Discharge Condition: Stable    CODE STATUS: Full    Diet Recommendation: Heart Healthy   Diet Order             Diet - low sodium heart healthy           Diet regular Room service appropriate? Yes; Fluid consistency: Thin  Diet effective now                    Chief Complaint  Patient presents with   Seizures     Brief history of present illness from the day of admission and additional interim summary    Patient is a 20 y.o. male with no significant history-presented to the ED with altered mental status, headache, fever, bilateral leg pain, seizure-like activity and hypotension-felt to have septic shock and acute metabolic encephalopathy.  Patient was briefly admitted to the ICU-Levophed was quickly weaned off-however he did require Precedex infusion in order to undergo LP/MRI studies.  He was eventually diagnosed with influenza A.  Upon stability-he was transferred to the [facility] on 11/19.  See below for further details.                                                                 Hospital Course   Septic shock: Due to influenza A infection-sepsis pathophysiology has completely resolved, his mentation is back to his baseline and he is  symptom-free on Tamiflu.  Eager to go home.  Will be discharged home, finish Tamiflu course in the outpatient setting follow-up with PCP in a week.   Acute metabolic encephalopathy/seizure-like activity: Due to septic shock/influenza-encephalopathy has resolved-patient is completely awake and alert this morning.  MRI brain/EEG negative for significant abnormalities.  CSF cultures in progress-but negative so far.  No indication for antiepileptic therapy at this point   Gram-positive rods bacteremia: Likely contaminant-follow repeat cultures ordered on 11/19, request PCP to check final results in a week.   Influenza A: Likely the etiology of his presenting symptoms-on Tamiflu.    Discharge diagnosis     Principal Problem:   Septic shock (HCC) Active Problems:   Altered mental status   Hypokalemia   Hypomagnesemia   Influenza A   Acute metabolic encephalopathy  Discharge instructions    Discharge Instructions     Diet - low sodium heart healthy   Complete by: As directed    Discharge instructions   Complete by: As directed    Follow with Primary MD in 7 days   Get CBC, CMP, 2 view Chest X ray -  checked next visit within 1 week by Primary MD   Activity: As tolerated with Full fall precautions use walker/cane & assistance as needed  Disposition Home   Diet: Heart Healthy    Special Instructions: If you have smoked or chewed Tobacco  in the last 2 yrs please stop smoking, stop any regular Alcohol  and or any Recreational drug use.  On your next visit with your primary care physician please Get Medicines reviewed and adjusted.  Please request your Prim.MD to go over all Hospital Tests and Procedure/Radiological results at the follow up, please get all Hospital records sent to your Prim MD by signing hospital release before you go home.  If you experience worsening of your admission symptoms, develop shortness of breath, life threatening emergency, suicidal or homicidal  thoughts you must seek medical attention immediately by calling 911 or calling your MD immediately  if symptoms less severe.  You Must read complete instructions/literature along with all the possible adverse reactions/side effects for all the Medicines you take and that have been prescribed to you. Take any new Medicines after you have completely understood and accpet all the possible adverse reactions/side effects.   Increase activity slowly   Complete by: As directed        Discharge Medications   Allergies as of 04/09/2021   No Known Allergies      Medication List     TAKE these medications    acetaminophen 500 MG tablet Commonly known as: TYLENOL Take 1,000 mg by mouth every 6 (six) hours as needed for mild pain.   oseltamivir 75 MG capsule Commonly known as: TAMIFLU Take 1 capsule (75 mg total) by mouth 2 (two) times daily.         Follow-up Information     [facility]. Schedule an appointment as soon as possible for a visit in 1 week(s).   Contact information: [address] [place] [address] [phone]                Major procedures and Radiology Reports - PLEASE review detailed and final reports thoroughly  -       CT HEAD WO CONTRAST ( )  Result Date: 04/06/2021 CLINICAL DATA:  First-time seizure EXAM: CT HEAD WITHOUT CONTRAST TECHNIQUE: Contiguous axial images were obtained from the base of the skull through the vertex without intravenous contrast. COMPARISON:  None FINDINGS: Brain: There is no evidence of acute intracranial hemorrhage, extra-axial fluid collection, or acute infarct. Parenchymal volume is normal. The ventricles are normal in size. There is no mass lesion. There is no midline shift. Vascular: No hyperdense vessel or unexpected calcification. Skull: Normal. Negative for fracture or focal lesion. Sinuses/Orbits: The paranasal sinuses are clear. The globes and orbits are  unremarkable. Other: None. IMPRESSION: Normal head CT. Electronically Signed   By: Lesia Hausen M.D.   On: 04/06/2021 14:53   MR BRAIN W WO CONTRAST  Result Date: 04/06/2021 CLINICAL DATA:  Fever and new onset seizure EXAM: MRI HEAD WITHOUT AND WITH CONTRAST TECHNIQUE: Multiplanar, multiecho pulse sequences of the brain and surrounding structures were obtained without and with intravenous contrast. CONTRAST:  7mL GADAVIST GADOBUTROL 1 MMOL/ML IV SOLN COMPARISON:  None. FINDINGS: Brain: No acute infarct, mass effect or extra-axial collection. No acute or chronic hemorrhage. Normal white matter signal, parenchymal volume and CSF spaces. The midline structures are normal. There is no abnormal contrast enhancement. Vascular: Major flow voids are preserved. Skull and upper cervical spine: Normal calvarium and skull base. Visualized upper cervical spine and soft tissues are normal. Sinuses/Orbits:No paranasal sinus fluid levels or advanced mucosal thickening. No mastoid or middle ear effusion. Normal orbits. IMPRESSION: Normal brain MRI. Electronically Signed   By: Deatra Robinson M.D.   On: 04/06/2021 21:09   MR THORACIC SPINE W WO CONTRAST  Result Date: 04/06/2021 CLINICAL DATA:  Fever and new onset seizure EXAM: MRI THORACIC AND LUMBAR SPINE WITHOUT AND WITH CONTRAST TECHNIQUE: Multiplanar and multiecho pulse sequences of the thoracic and lumbar spine were obtained without and with intravenous contrast. CONTRAST:  7mL GADAVIST GADOBUTROL 1 MMOL/ML IV SOLN COMPARISON:  None. FINDINGS: MRI THORACIC SPINE FINDINGS Alignment:  Physiologic. Vertebrae: No fracture, evidence of discitis, or bone lesion. Cord:  Normal signal and morphology. Paraspinal and other soft tissues: Negative. Disc levels: No disc herniation, spinal canal stenosis or neural foraminal stenosis. MRI LUMBAR SPINE FINDINGS Segmentation: Transitional lumbosacral anatomy with sacralization of L5 Alignment:  Physiologic. Vertebrae:  No fracture,  evidence of discitis, or bone lesion. Conus medullaris: Extends to the L1 level and appears normal. Paraspinal and other soft tissues: Distended urinary bladder Disc levels: No disc herniation, spinal canal stenosis or neural foraminal stenosis. No abnormal contrast enhancement. IMPRESSION: Normal MRI of the thoracic and lumbar spine. Electronically Signed   By: Deatra Robinson M.D.   On: 04/06/2021 21:29   MR Lumbar Spine W Wo Contrast  Result Date: 04/06/2021 CLINICAL DATA:  Fever and new onset seizure EXAM: MRI THORACIC AND LUMBAR SPINE WITHOUT AND WITH CONTRAST TECHNIQUE: Multiplanar and multiecho pulse sequences of the thoracic and lumbar spine were obtained without and with intravenous contrast. CONTRAST:  7mL GADAVIST GADOBUTROL 1 MMOL/ML IV SOLN COMPARISON:  None. FINDINGS: MRI THORACIC SPINE FINDINGS Alignment:  Physiologic. Vertebrae: No fracture, evidence of discitis, or bone lesion. Cord:  Normal signal and morphology. Paraspinal and other soft tissues: Negative. Disc levels: No disc herniation, spinal canal stenosis or neural foraminal stenosis. MRI LUMBAR SPINE FINDINGS Segmentation: Transitional lumbosacral anatomy with sacralization of L5 Alignment:  Physiologic. Vertebrae:  No fracture, evidence of discitis, or bone lesion. Conus medullaris: Extends to the L1 level and appears normal. Paraspinal and other soft tissues: Distended urinary bladder Disc levels: No disc herniation, spinal canal stenosis or neural foraminal stenosis. No abnormal contrast enhancement. IMPRESSION: Normal MRI of the thoracic and lumbar spine. Electronically Signed   By: Deatra Robinson M.D.   On: 04/06/2021 21:29   EEG adult  Result Date: 04/07/2021 Charlsie Quest, MD     04/07/2021 11:26 AM Patient Name: Stpehen Petitjean MRN: [id number] Epilepsy Attending: Charlsie Quest Referring Physician/Provider: Dr. Onalee Hua Date: 04/07/2021 Duration: 21.26 mins Patient history: 20 year old male with an episode of  syncopal event with possible seizure-like episodes.  EEG to evaluate for seizure. Level of alertness: Awake AEDs during EEG study: None Technical aspects: This EEG study was done with scalp electrodes positioned according to the 10-20 International system of electrode placement. Electrical activity was acquired at a sampling rate of  and reviewed with a high frequency filter of  and a low frequency filter of . EEG data were recorded continuously and digitally stored. Description: The  posterior dominant rhythm consists of 9-10 Hz activity of moderate voltage (25-35 uV) seen predominantly in posterior head regions, symmetric and reactive to eye opening and eye closing. Hyperventilation and photic stimulation were not performed.   IMPRESSION: This study is within normal limits. No seizures or epileptiform discharges were seen throughout the recording. Charlsie Quest   CT Angio Chest/Abd/Pel for Dissection W and/or Wo Contrast  Result Date: 04/06/2021 CLINICAL DATA:  Chest pain, back pain EXAM: CT ANGIOGRAPHY CHEST, ABDOMEN AND PELVIS TECHNIQUE: Non-contrast CT of the chest was initially obtained. Multidetector CT imaging through the chest, abdomen and pelvis was performed using the standard protocol during bolus administration of intravenous contrast. Multiplanar reconstructed images and MIPs were obtained and reviewed to evaluate the vascular anatomy. CONTRAST:  OMNIPAQUE IOHEXOL 350 MG/ML SOLN COMPARISON:  None. FINDINGS: CTA CHEST FINDINGS Cardiovascular: The heart is not enlarged. There is no pericardial effusion. There is no evidence of acute intramural hematoma or aortic dissection. The major branch vessels in the chest are normal. Mediastinum/Nodes: The thyroid is unremarkable. The esophagus is grossly unremarkable. There is no mediastinal, hilar, or axillary lymphadenopathy. Lungs/Pleura: The trachea and central airways are patent. The lungs are clear, with no focal consolidation or  pulmonary edema. There is no pleural effusion or pneumothorax. Musculoskeletal: There is no acute osseous abnormality or aggressive osseous lesion. Review of the MIP images confirms the above findings. CTA ABDOMEN AND PELVIS FINDINGS VASCULAR Aorta: Normal caliber aorta without aneurysm, dissection, vasculitis or significant stenosis. Celiac: Patent without evidence of aneurysm, dissection, vasculitis or significant stenosis. SMA: Patent without evidence of aneurysm, dissection, vasculitis or significant stenosis. Renals: Both renal arteries are patent without evidence of aneurysm, dissection, vasculitis, fibromuscular dysplasia or significant stenosis. IMA: Patent without evidence of aneurysm, dissection, vasculitis or significant stenosis. Inflow: Patent without evidence of aneurysm, dissection, vasculitis or significant stenosis. Veins: No obvious venous abnormality within the limitations of this arterial phase study. Review of the MIP images confirms the above findings. NON-VASCULAR Hepatobiliary: The liver and gallbladder are unremarkable. There is no biliary ductal dilatation. Pancreas: Unremarkable. Spleen: Unremarkable. Adrenals/Urinary Tract: The adrenals are unremarkable. There is a punctate nonobstructing left upper pole renal stone. The kidneys are otherwise unremarkable. There is no hydronephrosis or hydroureter. The bladder is unremarkable. Stomach/Bowel: The stomach is unremarkable. There is no evidence of bowel obstruction. There is no abnormal bowel wall thickening or inflammatory change. The appendix is normal. Lymphatic: There is no abdominal or pelvic lymphadenopathy. Reproductive: The prostate and seminal vesicles are unremarkable. Other: There is no ascites or free air. Musculoskeletal: There is no acute osseous abnormality or aggressive osseous lesion. Review of the MIP images confirms the above findings. IMPRESSION: 1. No acute findings in the chest, abdomen, or pelvis. No evidence of aortic  dissection. 2. Punctate nonobstructing left renal stone. Electronically Signed   By: Lesia Hausen M.D.   On: 04/06/2021 15:04     Today   Subjective    Ko Belair today has no headache,no chest abdominal pain,no new weakness tingling or numbness, feels much better wants to go home today.     Objective   Blood pressure 120/64, pulse 70, temperature 97.8 F (36.6 C), temperature source Oral, resp. rate 17, height 6\' 1"  (1.854 m), weight 65.3 kg, SpO2 97 %.   Intake/Output Summary (Last 24 hours) at 04/09/2021 1043 Last data filed at 04/09/2021 0840 Gross per 24 hour  Intake 317.81 ml  Output --  Net 317.81 ml    Exam  Awake Alert, No new F.N deficits, Normal affect [place].AT,PERRAL Supple Neck,No JVD, No cervical lymphadenopathy appriciated.  Symmetrical Chest wall movement, Good air movement bilaterally, CTAB RRR,No Gallops,Rubs or new Murmurs, No Parasternal Heave +ve B.Sounds, Abd Soft, Non tender, No organomegaly appriciated, No rebound -guarding or rigidity. No Cyanosis, Clubbing or edema, No new Rash or bruise   Data Review   CBC w Diff:  Lab Results  Component Value Date   WBC 8.9 04/07/2021   HGB 13.5 04/07/2021   HCT 42.1 04/07/2021   PLT 304 04/07/2021   LYMPHOPCT 7 04/06/2021   MONOPCT 6 04/06/2021   EOSPCT 1 04/06/2021   BASOPCT 1 04/06/2021    CMP:  Lab Results  Component Value Date   NA 136 04/09/2021   K 3.8 04/09/2021   CL 103 04/09/2021   CO2 26 04/09/2021   BUN 17 04/09/2021   CREATININE 0.96 04/09/2021   PROT 7.8 04/06/2021   ALBUMIN 4.4 04/06/2021   BILITOT 0.6 04/06/2021   ALKPHOS 91 04/06/2021   AST 29 04/06/2021   ALT 29 04/06/2021  .   Total Time in preparing paper work, data evaluation and todays exam - 35 minutes  Susa Raring M.D on 04/09/2021 at 10:43 AM  [facility]

## 2021-04-09 NOTE — Care Management (Signed)
Provided patient and mother coupons for several pharmacies to fill Tamiflu, prescription printed. Nurse reviewing with interpreter.

## 2021-04-10 LAB — CSF CULTURE W GRAM STAIN: Culture: NO GROWTH

## 2021-04-10 LAB — CULTURE, BLOOD (ROUTINE X 2): Special Requests: ADEQUATE

## 2021-04-10 LAB — GC/CHLAMYDIA PROBE AMP (~~LOC~~) NOT AT ARMC
Chlamydia: NEGATIVE
Comment: NEGATIVE
Comment: NORMAL
Neisseria Gonorrhea: NEGATIVE

## 2021-04-10 LAB — HSV 1/2 PCR, CSF
HSV-1 DNA: NEGATIVE
HSV-2 DNA: NEGATIVE

## 2021-04-11 LAB — CULTURE, BLOOD (ROUTINE X 2): Culture: NO GROWTH

## 2021-04-13 LAB — CULTURE, BLOOD (ROUTINE X 2)
Culture: NO GROWTH
Culture: NO GROWTH
Special Requests: ADEQUATE
Special Requests: ADEQUATE

## 2022-04-23 ENCOUNTER — Other Ambulatory Visit: Payer: Self-pay

## 2022-04-23 ENCOUNTER — Emergency Department (HOSPITAL_COMMUNITY): Payer: Self-pay

## 2022-04-23 ENCOUNTER — Emergency Department (HOSPITAL_COMMUNITY)
Admission: EM | Admit: 2022-04-23 | Discharge: 2022-04-23 | Disposition: A | Discharge status: Home / Self Care | Payer: Self-pay | Attending: Emergency Medicine | Admitting: Emergency Medicine

## 2022-04-23 DIAGNOSIS — N50812 Left testicular pain: Secondary | ICD-10-CM | POA: Insufficient documentation

## 2022-04-23 LAB — URINALYSIS, ROUTINE W REFLEX MICROSCOPIC
Bilirubin Urine: NEGATIVE
Glucose, UA: NEGATIVE mg/dL
Hgb urine dipstick: NEGATIVE
Ketones, ur: NEGATIVE mg/dL
Leukocytes,Ua: NEGATIVE
Nitrite: NEGATIVE
Protein, ur: NEGATIVE mg/dL
Specific Gravity, Urine: 1.009 (ref 1.005–1.030)
pH: 6 (ref 5.0–8.0)

## 2022-04-23 MED ORDER — LIDOCAINE HCL (PF) 1 % IJ SOLN
INTRAMUSCULAR | Status: AC
  Administered 2022-04-23: 1.2 mL
  Filled 2022-04-23: qty 5

## 2022-04-23 MED ORDER — CEFTRIAXONE SODIUM 500 MG IJ SOLR
500.0000 mg | Freq: Once | INTRAMUSCULAR | Status: AC
  Administered 2022-04-23: 500 mg via INTRAMUSCULAR
  Filled 2022-04-23: qty 500

## 2022-04-23 MED ORDER — DOXYCYCLINE HYCLATE 100 MG PO TABS
100.0000 mg | ORAL_TABLET | Freq: Once | ORAL | Status: AC
  Administered 2022-04-23: 100 mg via ORAL
  Filled 2022-04-23: qty 1

## 2022-04-23 MED ORDER — LIDOCAINE HCL (PF) 1 % IJ SOLN: 1.0000 mL | Freq: Once | INTRAMUSCULAR | Status: AC

## 2022-04-23 MED ORDER — IBUPROFEN 400 MG PO TABS
600.0000 mg | ORAL_TABLET | Freq: Once | ORAL | Status: AC
  Administered 2022-04-23: 600 mg via ORAL
  Filled 2022-04-23: qty 1

## 2022-04-23 MED ORDER — DOXYCYCLINE HYCLATE 100 MG PO CAPS
100.0000 mg | ORAL_CAPSULE | Freq: Two times a day (BID) | ORAL | 0 refills | Status: AC
  Filled 2022-04-23: qty 13, 7d supply, fill #0

## 2022-04-23 NOTE — ED Triage Notes (Signed)
Pt reports headache and L testicle pain. Testicle pain x 1 month and headache x 1 year. Endorses intermittent burning with urination, denies penile discharge.

## 2022-04-23 NOTE — ED Provider Triage Note (Signed)
Emergency Medicine Provider Triage Evaluation Note  Eric Hudson , a 21 y.o. male  was evaluated in triage.  Pt complains of left-sided testicular pain which been ongoing for approximately 1 month.  Patient endorses some pain with urination.  Denies discharge or swelling in the penis or testicles.  Patient also complains of posterior headache which has been ongoing for over a year since the hospital admission in November 2022 for delirium  Review of Systems  Positive: As above Negative: As above  Physical Exam  There were no vitals taken for this visit. Gen:   Awake, no distress   Resp:  Normal effort  MSK:   Moves extremities without difficulty  Other:    Medical Decision Making  Medically screening exam initiated at 1:19 PM.  Appropriate orders placed.  Eric Hudson was informed that the remainder of the evaluation will be completed by another provider, this initial triage assessment does not replace that evaluation, and the importance of remaining in the ED until their evaluation is complete.     Darrick Grinder, PA-C 04/23/22 1320

## 2022-04-23 NOTE — Discharge Instructions (Signed)
Your urine and ultrasound results were normal and showed no abnormality.  Follow-up with your MyChart for the STD test.  Take the antibiotics as prescribed.  Use condoms and perform safe sex.  If your tests are positive, please let sexual partners know so they can be treated.  Take Tylenol and ibuprofen for pain.  Come back for any severe worsening pain, uncontrollable vomiting, high fevers with pain, or any other symptoms concerning to you.  Los resultados de su orina y ultrasonido fueron normales y no mostraron ninguna anomala. Haga un seguimiento con su MyChart para la prueba de ETS. Tome los antibiticos segn lo recetado. Utilice condones y practique sexo seguro. Si sus pruebas son positivas, informe a sus parejas sexuales para que puedan recibir tratamiento. Tome Tylenol e ibuprofeno para Chief Technology Officer. Regrese si su dolor empeora, vmitos incontrolables, fiebre alta con dolor o cualquier otro sntoma que le preocupe.

## 2022-04-23 NOTE — ED Provider Notes (Signed)
[facility] EMERGENCY DEPARTMENT Provider Note   CSN: [id number] Arrival date & time: 04/23/22  1302     History  Chief Complaint  Patient presents with   Testicle Pain    Eric Hudson is a 21 y.o. male.  Otherwise healthy who presents with atraumatic left testicular pain.  Pain has been intermittent.  He has been taking some over-the-counter pain medications with some relief.  He denies any trauma.  His last intercourse was about 2 weeks prior.  Denies any abnormal burning, polyuria, dysuria, hematuria, flank pain or abdominal pain, fevers, nausea or vomiting.  He is concerned for possible STD.   Testicle Pain       Home Medications Prior to Admission medications   Medication Sig Start Date End Date Taking? Authorizing Provider  doxycycline (VIBRAMYCIN) 100 MG capsule Take 1 capsule (100 mg total) by mouth 2 (two) times daily for 7 days. 04/24/22 05/01/22 Yes Mardene Sayer, MD  acetaminophen (TYLENOL) 500 MG tablet Take 1,000 mg by mouth every 6 (six) hours as needed for mild pain.    [provider]  oseltamivir (TAMIFLU) 75 MG capsule Take 1 capsule (75 mg total) by mouth 2 (two) times daily. 04/09/21   Leroy Sea, MD      Allergies    Patient has no known allergies.    Review of Systems   Review of Systems  Genitourinary:  Positive for testicular pain.    Physical Exam Updated Vital Signs BP 128/84   Pulse 62   Temp 98.3 F (36.8 C)   Resp 16   SpO2 100%  Physical Exam Constitutional: Alert and oriented. Well appearing and in no distress. Eyes: Conjunctivae are normal. ENT      Head: Normocephalic and atraumatic.      Nose: No congestion.      Mouth/Throat: Mucous membranes are moist.      Neck: No stridor. Cardiovascular: S1, S2,  RRR Respiratory: Normal respiratory effort.O2 sat 100 on RA Gastrointestinal: Soft and nontender. There is no CVA tenderness. GU: performed with RN chaperone. Descended bilateral testes  with no edema, no erythema, no skin changes, mild tenderness to palpation of the left epididymis, cremaster reflexes present Musculoskeletal: Normal range of motion in all extremities. Neurologic: Normal speech and language. No gross focal neurologic deficits are appreciated. Skin: Skin is warm, dry and intact. No rash noted. Psychiatric: Mood and affect are normal. Speech and behavior are normal.  ED Results / Procedures / Treatments   Labs (all labs ordered are listed, but only abnormal results are displayed) Labs Reviewed  URINALYSIS, ROUTINE W REFLEX MICROSCOPIC - Abnormal; Notable for the following components:      Result Value   Color, Urine STRAW (*)    All other components within normal limits  GC/CHLAMYDIA PROBE AMP ([facility]) NOT AT [facility]    EKG None  Radiology US Scrotum  Result Date: 04/23/2022 CLINICAL DATA:  Left-sided testicular pain for 8 days EXAM: SCROTAL ULTRASOUND DOPPLER ULTRASOUND OF THE TESTICLES TECHNIQUE: Complete ultrasound examination of the testicles, epididymis, and other scrotal structures was performed. Color and spectral Doppler ultrasound were also utilized to evaluate blood flow to the testicles. COMPARISON:  None available FINDINGS: Right testicle Measurements: 4.7 x 2.1 x 2.6 cm. No mass or microlithiasis visualized. Left testicle Measurements: 4.2 x 2.3 x 2.8 cm. No mass or microlithiasis visualized. Right epididymis:  Normal in size and appearance. Left epididymis:  Normal in size and appearance. Hydrocele:  None visualized.  Varicocele:  None visualized. Pulsed Doppler interrogation of both testes demonstrates normal low resistance arterial and venous waveforms bilaterally. IMPRESSION: Normal scrotal ultrasound with Doppler. No explanation for left-sided pain. Electronically Signed   By: Abigail Miyamoto M.D.   On: 04/23/2022 15:41    Procedures Procedures    Medications Ordered in ED Medications  ibuprofen (ADVIL) tablet 600 mg (has no  administration in time range)  cefTRIAXone (ROCEPHIN) injection 500 mg (has no administration in time range)  lidocaine (PF) (XYLOCAINE) 1 % injection 1-2.1 mL (has no administration in time range)  doxycycline (VIBRA-TABS) tablet 100 mg (has no administration in time range)    ED Course/ Medical Decision Making/ A&P                           Medical Decision Making  Eric Hudson is a 21 y.o. male.  Otherwise healthy who presents with atraumatic left testicular pain.  Pain has been intermittent.   Patient's testicular exam was normal and reassuring.  Based on story, unlikely nephrolithiasis or testicular torsion.  Consider possible UTI, epididymitis or hydrocele among multiple other abnormalities.  UA obtained which I personally reviewed showed no hemoglobin or RBCs, no concern for nephrolithiasis and no nitrate or leukocyte esterase, not consistent with UTI.  Ultrasound of the scrotum obtained which showed good color flow in bilateral testes, no evidence of torsion, no swelling or hydrocele or other abnormalities appreciated on exam.  Shared decision making with patient, since recent sexual activity and concern for STD, he is choosing to get empirically treated for STD.  He will receive Rocephin and first dose doxycycline here and received 7 days total course of doxycycline.  Advised continued safe sex and follow-up with MyChart results for chlamydia/gonorrhea and to share results with any recent sexual partners as well if positive.  Advised continue Tylenol and ibuprofen for pain control and return precautions.  He is in agreement with plan is for discharge.  Risk Prescription drug management.    Final Clinical Impression(s) / ED Diagnoses Final diagnoses:  Left testicular pain    Rx / DC Orders ED Discharge Orders          Ordered    doxycycline (VIBRAMYCIN) 100 MG capsule  2 times daily        04/23/22 1749              Elgie Congo, MD 04/23/22 1757

## 2022-04-24 ENCOUNTER — Other Ambulatory Visit (HOSPITAL_COMMUNITY): Payer: Self-pay

## 2022-04-24 NOTE — Progress Notes (Signed)
Patient presented to ED on 04/23/22 and discharge doxycycline prescription sent to [facility] after hours. Called patient and prescription being transferred to [facility] on [address] in [place] (phone number [phone]) by [facility] pharmacy. Patient phone call and education completed via Spanish phone interpretor.    Gerrit Halls, PharmD, BCPS Clinical Pharmacist 04/24/2022 2:42 PM
